# Patient Record
Sex: Male | Born: 1980 | Race: Black or African American | Hispanic: No | Marital: Single | Smoking: Current some day smoker
Health system: Southern US, Community
[De-identification: ages and names within clinical notes are randomized; demographics above are authoritative.]

---

## 2004-03-03 ENCOUNTER — Emergency Department: Payer: Self-pay | Admitting: Internal Medicine

## 2006-02-15 ENCOUNTER — Emergency Department: Payer: Self-pay | Admitting: Emergency Medicine

## 2006-12-16 ENCOUNTER — Emergency Department: Payer: Self-pay | Admitting: Emergency Medicine

## 2008-02-15 ENCOUNTER — Emergency Department: Payer: Self-pay | Admitting: Emergency Medicine

## 2015-02-11 ENCOUNTER — Emergency Department
Admission: EM | Admit: 2015-02-11 | Discharge: 2015-02-11 | Disposition: A | Discharge status: Home / Self Care | Payer: Self-pay | Attending: Emergency Medicine | Admitting: Emergency Medicine

## 2015-02-11 DIAGNOSIS — F1910 Other psychoactive substance abuse, uncomplicated: Secondary | ICD-10-CM

## 2015-02-11 DIAGNOSIS — F1410 Cocaine abuse, uncomplicated: Secondary | ICD-10-CM | POA: Insufficient documentation

## 2015-02-11 DIAGNOSIS — F1010 Alcohol abuse, uncomplicated: Secondary | ICD-10-CM | POA: Insufficient documentation

## 2015-02-11 DIAGNOSIS — Z720 Tobacco use: Secondary | ICD-10-CM | POA: Insufficient documentation

## 2015-02-11 NOTE — Discharge Instructions (Signed)
Return to the emergency department for any worsening condition including chest pain, trouble breathing, palpitations, dizziness or passing out, confusion, weakness, or any other symptoms concerning to you.   Polysubstance Abuse When people abuse more than one drug or type of drug it is called polysubstance or polydrug abuse. For example, many smokers also drink alcohol. This is one form of polydrug abuse. Polydrug abuse also refers to the use of a drug to counteract an unpleasant effect produced by another drug. It may also be used to help with withdrawal from another drug. People who take stimulants may become agitated. Sometimes this agitation is countered with a tranquilizer. This helps protect against the unpleasant side effects. Polydrug abuse also refers to the use of different drugs at the same time.  Anytime drug use is interfering with normal living activities, it has become abuse. This includes problems with family and friends. Psychological dependence has developed when your mind tells you that the drug is needed. This is usually followed by physical dependence which has developed when continuing increases of drug are required to get the same feeling or "high". This is known as addiction or chemical dependency. A person's risk is much higher if there is a history of chemical dependency in the family. SIGNS OF CHEMICAL DEPENDENCY  You have been told by friends or family that drugs have become a problem.  You fight when using drugs.  You are having blackouts (not remembering what you do while using).  You feel sick from using drugs but continue using.  You lie about use or amounts of drugs (chemicals) used.  You need chemicals to get you going.  You are suffering in work performance or in school because of drug use.  You get sick from use of drugs but continue to use anyway.  You need drugs to relate to people or feel comfortable in social situations.  You use drugs to forget  problems. "Yes" answered to any of the above signs of chemical dependency indicates there are problems. The longer the use of drugs continues, the greater the problems will become. If there is a family history of drug or alcohol use, it is best not to experiment with these drugs. Continual use leads to tolerance. After tolerance develops more of the drug is needed to get the same feeling. This is followed by addiction. With addiction, drugs become the most important part of life. It becomes more important to take drugs than participate in the other usual activities of life. This includes relating to friends and family. Addiction is followed by dependency. Dependency is a condition where drugs are now needed not just to get high, but to feel normal. Addiction cannot be cured but it can be stopped. This often requires outside help and the care of professionals. Treatment centers are listed in the yellow pages under: Cocaine, Narcotics, and Alcoholics Anonymous. Most hospitals and clinics can refer you to a specialized care center. Talk to your caregiver if you need help.   This information is not intended to replace advice given to you by your health care provider. Make sure you discuss any questions you have with your health care provider.   Document Released: 11/17/2004 Document Revised: 06/20/2011 Document Reviewed: 04/02/2014 Elsevier Interactive Patient Education [employer]2016 Elsevier Inc.

## 2015-02-11 NOTE — ED Provider Notes (Signed)
[facility] Emergency Department Provider Note   ____________________________________________  Time seen: 6:20 PM I have reviewed the triage vital signs and the triage nursing note.  HISTORY  Chief Complaint Medical Clearance   Historian Patient  HPI Jonathan MarionLawrence Franckowiak Jr. is a 34 y.o. male is here for "medical clearance "for evaluation/admission for drug [facility] center [facility].  Reports a history of using cocaine for which he would like to quit.  He also drinks when he is using cocaine. He denies narcotic use.  States she's never been to a drug rehabilitation program before. No depression or suicidal ideation.  Patient is a symptomatic at present, with no chest pain or trouble breathing.    History reviewed. No pertinent past medical history. polysubstance abuse  There are no active problems to display for this patient.   History reviewed. No pertinent past surgical history.  No current outpatient prescriptions on file.  Allergies Review of patient's allergies indicates no known allergies.  History reviewed. No pertinent family history.  Social History Social History  Substance Use Topics  . Smoking status: Current Some Day Smoker  . Smokeless tobacco: None  . Alcohol Use: 14.4 oz/week    24 Cans of beer per week    Review of Systems  Constitutional: Negative for fever. Eyes: Negative for visual changes. ENT: Negative for sore throat. Cardiovascular: Negative for chest pain. Respiratory: Negative for shortness of breath. Gastrointestinal: Negative for abdominal pain, vomiting and diarrhea. Genitourinary: Negative for dysuria. Musculoskeletal: Negative for back pain. Skin: Negative for rash. Neurological: Negative for headache. 10 point Review of Systems otherwise negative ____________________________________________   PHYSICAL EXAM:  VITAL SIGNS: ED Triage Vitals  Enc Vitals Group     BP --       Pulse Rate 02/11/15 1545 92     Resp --      Temp 02/11/15 1545 98.1 F (36.7 C)     Temp Source 02/11/15 1545 Oral     SpO2 02/11/15 1545 95 %     Weight 02/11/15 1545 216 lb (97.977 kg)     Height 02/11/15 1545 5\' 9"  (1.753 m)     Head Cir --      Peak Flow --      Pain Score --      Pain Loc --      Pain Edu? --      Excl. in GC? --      Constitutional: Alert and oriented. Well appearing and in no distress. Eyes: Conjunctivae are normal. PERRL. Normal extraocular movements. ENT   Head: Normocephalic and atraumatic.   Nose: No congestion/rhinnorhea.   Mouth/Throat: Mucous membranes are moist.   Neck: No stridor. Cardiovascular/Chest: Normal rate, regular rhythm.  No murmurs, rubs, or gallops. Respiratory: Normal respiratory effort without tachypnea nor retractions. Breath sounds are clear and equal bilaterally. No wheezes/rales/rhonchi. Gastrointestinal: Soft. No distention, no guarding, no rebound. Nontender  Genitourinary/rectal:Deferred Musculoskeletal: Nontender with normal range of motion in all extremities.  Neurologic:  Normal speech and language. No gross or focal neurologic deficits are appreciated. Skin:  Skin is warm, dry and intact. No rash noted. Psychiatric: Mood and affect are normal. Speech and behavior are normal. Patient exhibits appropriate insight and judgment.  ____________________________________________   EKG I, Governor Rooksebecca Larico Dimock, MD, the attending physician have personally viewed and interpreted all ECGs.   No EKG performed ____________________________________________  LABS (pertinent positives/negatives)  None  ____________________________________________  RADIOLOGY All Xrays were viewed by me. Imaging interpreted  by Radiologist.  None __________________________________________  PROCEDURES  Procedure(s) performed: None  Critical Care performed: None  ____________________________________________   ED COURSE /  ASSESSMENT AND PLAN  CONSULTATIONS: None  Pertinent labs & imaging results that were available during my care of the patient were reviewed by me and considered in my medical decision making (see chart for details).   Patient is well-appearing and asymptomatic. He does not appear to be intoxicated nor in any withdrawal syndrome. His vital signs are stable. No concern for psychiatric emergency condition.  Patient is stable for discharge, where he can follow-up with residential treatment services.  Patient / Family / Caregiver informed of clinical course, medical decision-making process, and agree with plan.   I discussed return precautions, follow-up instructions, and discharged instructions with patient and/or family.  ___________________________________________   FINAL CLINICAL IMPRESSION(S) / ED DIAGNOSES   Final diagnoses:  Cocaine abuse  Alcohol abuse  Substance abuse       Governor Rooks, MD 02/11/15 [phone]

## 2015-02-11 NOTE — ED Notes (Signed)
Pt verbalized understanding of follow up care. Has ride to [facility] and knows the address. [facility] called and made aware that pt is on the way.

## 2015-02-11 NOTE — ED Notes (Signed)
Patient here for medical clearance 

## 2015-10-11 ENCOUNTER — Emergency Department
Admission: EM | Admit: 2015-10-11 | Discharge: 2015-10-11 | Disposition: A | Discharge status: Home / Self Care | Payer: Self-pay | Attending: Emergency Medicine | Admitting: Emergency Medicine

## 2015-10-11 DIAGNOSIS — R401 Stupor: Secondary | ICD-10-CM | POA: Insufficient documentation

## 2015-10-11 DIAGNOSIS — F1290 Cannabis use, unspecified, uncomplicated: Secondary | ICD-10-CM | POA: Insufficient documentation

## 2015-10-11 DIAGNOSIS — F14121 Cocaine abuse with intoxication with delirium: Secondary | ICD-10-CM | POA: Insufficient documentation

## 2015-10-11 DIAGNOSIS — F17200 Nicotine dependence, unspecified, uncomplicated: Secondary | ICD-10-CM | POA: Insufficient documentation

## 2015-10-11 LAB — HEPATIC FUNCTION PANEL
ALBUMIN: 5.3 g/dL — AB (ref 3.5–5.0)
ALK PHOS: 69 U/L (ref 38–126)
ALT: 79 U/L — AB (ref 17–63)
AST: 68 U/L — AB (ref 15–41)
BILIRUBIN TOTAL: 0.8 mg/dL (ref 0.3–1.2)
Bilirubin, Direct: <0.1 mg/dL — ABNORMAL LOW (ref 0.1–0.5)
Total Protein: 9.3 g/dL — ABNORMAL HIGH (ref 6.5–8.1)

## 2015-10-11 LAB — URINALYSIS COMPLETE WITH MICROSCOPIC (ARMC ONLY)
BACTERIA UA: NONE SEEN
BILIRUBIN URINE: NEGATIVE
Glucose, UA: NEGATIVE mg/dL
LEUKOCYTES UA: NEGATIVE
NITRITE: NEGATIVE
PH: 5 (ref 5.0–8.0)
PROTEIN: 100 mg/dL — AB
SPECIFIC GRAVITY, URINE: 1.013 (ref 1.005–1.030)

## 2015-10-11 LAB — CBC WITH DIFFERENTIAL/PLATELET
Basophils Absolute: 0 10*3/uL (ref 0–0.1)
Basophils Relative: 0 %
EOS ABS: 0 10*3/uL (ref 0–0.7)
EOS PCT: 0 %
HCT: 45.4 % (ref 40.0–52.0)
Hemoglobin: 15.1 g/dL (ref 13.0–18.0)
LYMPHS ABS: 0.9 10*3/uL — AB (ref 1.0–3.6)
LYMPHS PCT: 7 %
MCH: 28.3 pg (ref 26.0–34.0)
MCHC: 33.2 g/dL (ref 32.0–36.0)
MCV: 85.4 fL (ref 80.0–100.0)
MONO ABS: 0.7 10*3/uL (ref 0.2–1.0)
MONOS PCT: 5 %
Neutro Abs: 12 10*3/uL — ABNORMAL HIGH (ref 1.4–6.5)
Neutrophils Relative %: 88 %
PLATELETS: 314 10*3/uL (ref 150–440)
RBC: 5.32 MIL/uL (ref 4.40–5.90)
RDW: 14.5 % (ref 11.5–14.5)
WBC: 13.7 10*3/uL — AB (ref 3.8–10.6)

## 2015-10-11 LAB — BASIC METABOLIC PANEL
Anion gap: 17 — ABNORMAL HIGH (ref 5–15)
BUN: 23 mg/dL — AB (ref 6–20)
CHLORIDE: 109 mmol/L (ref 101–111)
CO2: 17 mmol/L — ABNORMAL LOW (ref 22–32)
CREATININE: 1.83 mg/dL — AB (ref 0.61–1.24)
Calcium: 10.1 mg/dL (ref 8.9–10.3)
GFR calc Af Amer: 53 mL/min — ABNORMAL LOW (ref >60)
GFR calc non Af Amer: 46 mL/min — ABNORMAL LOW (ref >60)
GLUCOSE: 95 mg/dL (ref 65–99)
Potassium: 3.8 mmol/L (ref 3.5–5.1)
SODIUM: 143 mmol/L (ref 135–145)

## 2015-10-11 LAB — URINE DRUG SCREEN, QUALITATIVE (ARMC ONLY)
Amphetamines, Ur Screen: NOT DETECTED
BENZODIAZEPINE, UR SCRN: NOT DETECTED
Barbiturates, Ur Screen: NOT DETECTED
CANNABINOID 50 NG, UR ~~LOC~~: NOT DETECTED
Cocaine Metabolite,Ur ~~LOC~~: POSITIVE — AB
MDMA (ECSTASY) UR SCREEN: NOT DETECTED
Methadone Scn, Ur: NOT DETECTED
OPIATE, UR SCREEN: NOT DETECTED
PHENCYCLIDINE (PCP) UR S: NOT DETECTED
Tricyclic, Ur Screen: NOT DETECTED

## 2015-10-11 LAB — LIPASE, BLOOD: Lipase: 17 U/L (ref 11–51)

## 2015-10-11 LAB — TROPONIN I
Troponin I: 0.04 ng/mL (ref <0.03)
Troponin I: 0.07 ng/mL (ref <0.03)

## 2015-10-11 LAB — SALICYLATE LEVEL

## 2015-10-11 LAB — ETHANOL: ALCOHOL ETHYL (B): 18 mg/dL — AB (ref <5)

## 2015-10-11 LAB — ACETAMINOPHEN LEVEL: Acetaminophen (Tylenol), Serum: <10 ug/mL — ABNORMAL LOW (ref 10–30)

## 2015-10-11 MED ORDER — SODIUM CHLORIDE 0.9 % IV BOLUS (SEPSIS)
500.0000 mL | Freq: Once | INTRAVENOUS | Status: AC
  Administered 2015-10-11: 500 mL via INTRAVENOUS

## 2015-10-11 MED ORDER — LORAZEPAM 2 MG/ML IJ SOLN
4.0000 mg | Freq: Once | INTRAMUSCULAR | Status: AC
  Administered 2015-10-11: 4 mg via INTRAVENOUS

## 2015-10-11 MED ORDER — ASPIRIN 81 MG PO CHEW
324.0000 mg | CHEWABLE_TABLET | Freq: Once | ORAL | Status: AC
  Administered 2015-10-11: 324 mg via ORAL
  Filled 2015-10-11: qty 4

## 2015-10-11 MED ORDER — CHARCOAL ACTIVATED PO LIQD
ORAL | Status: AC
  Filled 2015-10-11: qty 240

## 2015-10-11 MED ORDER — ACTIDOSE WITH SORBITOL 50 GM/240ML PO LIQD
50.0000 g | Freq: Once | ORAL | Status: DC
  Filled 2015-10-11: qty 240

## 2015-10-11 MED ORDER — LORAZEPAM 2 MG/ML IJ SOLN
INTRAMUSCULAR | Status: AC
  Administered 2015-10-11: 4 mg via INTRAVENOUS
  Filled 2015-10-11: qty 2

## 2015-10-11 MED ORDER — CHARCOAL ACTIVATED PO LIQD
ORAL | Status: AC
  Administered 2015-10-11: 50 g
  Filled 2015-10-11: qty 240

## 2015-10-11 MED ORDER — CHARCOAL ACTIVATED PO LIQD: 50.0000 g | Freq: Once | ORAL | Status: DC

## 2015-10-11 NOTE — Discharge Instructions (Signed)
Polysubstance Abuse °When people abuse more than one drug or type of drug it is called polysubstance or polydrug abuse. For example, many smokers also drink alcohol. This is one form of polydrug abuse. Polydrug abuse also refers to the use of a drug to counteract an unpleasant effect produced by another drug. It may also be used to help with withdrawal from another drug. People who take stimulants may become agitated. Sometimes this agitation is countered with a tranquilizer. This helps protect against the unpleasant side effects. Polydrug abuse also refers to the use of different drugs at the same time.  °Anytime drug use is interfering with normal living activities, it has become abuse. This includes problems with family and friends. Psychological dependence has developed when your mind tells you that the drug is needed. This is usually followed by physical dependence which has developed when continuing increases of drug are required to get the same feeling or "high". This is known as addiction or chemical dependency. A person's risk is much higher if there is a history of chemical dependency in the family. °SIGNS OF CHEMICAL DEPENDENCY °· You have been told by friends or family that drugs have become a problem. °· You fight when using drugs. °· You are having blackouts (not remembering what you do while using). °· You feel sick from using drugs but continue using. °· You lie about use or amounts of drugs (chemicals) used. °· You need chemicals to get you going. °· You are suffering in work performance or in school because of drug use. °· You get sick from use of drugs but continue to use anyway. °· You need drugs to relate to people or feel comfortable in social situations. °· You use drugs to forget problems. °"Yes" answered to any of the above signs of chemical dependency indicates there are problems. The longer the use of drugs continues, the greater the problems will become. °If there is a family history of  drug or alcohol use, it is best not to experiment with these drugs. Continual use leads to tolerance. After tolerance develops more of the drug is needed to get the same feeling. This is followed by addiction. With addiction, drugs become the most important part of life. It becomes more important to take drugs than participate in the other usual activities of life. This includes relating to friends and family. Addiction is followed by dependency. Dependency is a condition where drugs are now needed not just to get high, but to feel normal. °Addiction cannot be cured but it can be stopped. This often requires outside help and the care of professionals. Treatment centers are listed in the yellow pages under: Cocaine, Narcotics, and Alcoholics Anonymous. Most hospitals and clinics can refer you to a specialized care center. Talk to your caregiver if you need help. °  °This information is not intended to replace advice given to you by your health care provider. Make sure you discuss any questions you have with your health care provider. °  °Document Released: 11/17/2004 Document Revised: 06/20/2011 Document Reviewed: 04/02/2014 °Elsevier Interactive Patient Education ©2016 Elsevier Inc. ° °

## 2015-10-11 NOTE — ED Notes (Signed)
Pt able to answer questions and cooperative. States he only took cocaine. Denies that it was laced with anything. Denies trying to harm himself or others.

## 2015-10-11 NOTE — ED Notes (Signed)
Patient discharge and follow up information reviewed with patient by ED nursing staff and patient given the opportunity to ask questions pertaining to ED visit and discharge plan of care. Patient advised that should symptoms not continue to improve, resolve entirely, or should new symptoms develop then a follow up visit with their PCP or a return visit to the ED may be warranted. Patient verbalized consent and understanding of discharge plan of care including potential need for further evaluation. Patient being discharged in stable condition per attending ED physician on duty. Pt given resources for addiction/substance abuse help and verbalizes understanding of effects of abuse on his body and need to follow up for help.

## 2015-10-11 NOTE — ED Notes (Signed)
Brought in by police, incoherent per mother and possible drug overdose per mom.

## 2015-10-11 NOTE — ED Provider Notes (Signed)
-----------------------------------------   7:50 PM on 10/11/2015 -----------------------------------------   Blood pressure 114/74, pulse 110, temperature 98.3 F (36.8 C), temperature source Oral, resp. rate 16, height 5\' 10"  (1.778 m), weight 200 lb (90.719 kg), SpO2 98 %.  Assuming care from Dr. Scotty CourtStafford.  In short, Alain MarionLawrence Vandenberghe Jr. is a 35 y.o. male with a chief complaint of Ingestion .  Refer to the original H&P for additional details.  The current plan of care is to *follow patient's troponin and progress here in emergency department. The patient did receive serial troponins the first level was 0.04 and repeat was 0.07. These levels were reviewed with the on-call cardiologist and since the patient has no complaints of chest pain or any ischemic changes on his EKG and had used a combination of cocaine and alcohol these are not unexpected results. The patient denies being suicidal, homicidal or having hallucinations. He's progressed well with the IM Ativan that was given on arrival. Has remained cooperative here in emergency department. He is requesting outpatient services for drug treatment program and these will be supplied to him. His IVC paperwork was removed by myself as I could not retain him now than he is awake alert and oriented and ambulatory. He continues tonight any suicidal thoughts, homicidal thoughts, hallucinations. Patient will be given cocaine and substance abuse instructions and advised to follow-up for outpatient treatment as mentioned above. He is given some IV fluids for what appeared to be some mild renal insufficiency which is likely dehydration.   Jennye MoccasinBrian S Mattilyn Crites, MD 10/11/15 [phone]

## 2015-10-11 NOTE — ED Notes (Signed)
Pt medicated with ativan 4 mg IM = pt is unable to focus, answer questions or follow directions of even the simplest type. Pt is here for ingestion of unknown drugs. Known to use cocaine and amphetamines. Officers and Customer service managerquad tech in with pt

## 2015-10-11 NOTE — ED Provider Notes (Addendum)
[facility] Emergency Department Provider Note  ____________________________________________  Time seen: 2:45 PM  I have reviewed the triage vital signs and the nursing notes.   HISTORY  Chief Complaint Ingestion  Level 5 caveat:  Portions of the history and physical were unable to be obtained due to the patient's acute altered mental status   HPI Alain MarionLawrence Ruder Jr. is a 35 y.o. male brought to the ED by police under involuntary commitment due to altered mental status. They were called to the mother's house with the patient showed up and was acting bizarrely and incoherently and the mother was worried about her safety.Patient does not respond to any questions. He is not interactive. Police report that they are familiar with this person and he has a history of cocaine abuse.  Currently being physically restrained by 4 police officers Sheriff and a med tech.     No past medical history on file. Cocaine abuse  There are no active problems to display for this patient.    No past surgical history on file. None  No current outpatient prescriptions on file. None  Allergies Review of patient's allergies indicates no known allergies.   No family history on file.  Social History Social History  Substance Use Topics  . Smoking status: Current Some Day Smoker  . Smokeless tobacco: Not on file  . Alcohol Use: 14.4 oz/week    24 Cans of beer per week    Review of Systems Unable to obtain ____________________________________________   PHYSICAL EXAM:  VITAL SIGNS: ED Triage Vitals  Enc Vitals Group     BP --      Pulse --      Resp --      Temp --      Temp src --      SpO2 --      Weight --      Height --      Head Cir --      Peak Flow --      Pain Score --      Pain Loc --      Pain Edu? --      Excl. in GC? --     Vital signs reviewed, nursing assessments reviewed.   Constitutional:   Awake, stuporous. No distress. Eyes:    No scleral icterus. No conjunctival pallor. PERRL. EOMI. Marland Kitchen. ENT   Head:   Normocephalic and atraumatic.   Nose:   No congestion/rhinnorhea. No septal hematoma   Mouth/Throat:   MMM, no pharyngeal erythema. No peritonsillar mass. White foamy material in the mouth   Neck:   No stridor. No SubQ emphysema. No meningismus. Hematological/Lymphatic/Immunilogical:   No cervical lymphadenopathy. Cardiovascular:   RRR. Symmetric bilateral radial and DP pulses.  No murmurs.  Respiratory:   Normal respiratory effort without tachypnea nor retractions. Breath sounds are clear and equal bilaterally. No wheezes/rales/rhonchi. Gastrointestinal:   Soft and nontender. Non distended. There is no CVA tenderness.  No rebound, rigidity, or guarding. Genitourinary:   deferred Musculoskeletal:   Nontender with normal range of motion in all extremities. No joint effusions.  No lower extremity tenderness.  No edema. Neurologic:   Not interactive, stuporous CN 2-10 normal. Motor grossly intact. No gross focal neurologic deficits are appreciated.  Skin:    Skin is warm, dry and intact. No rash noted.  No petechiae, purpura, or bullae.  ____________________________________________    LABS (pertinent positives/negatives) (all labs ordered are listed, but only abnormal results are displayed)  Labs Reviewed  URINALYSIS COMPLETEWITH MICROSCOPIC ([facility] ONLY)  URINE DRUG SCREEN, QUALITATIVE ([facility] ONLY)  BASIC METABOLIC PANEL  CBC WITH DIFFERENTIAL/PLATELET  TROPONIN I  HEPATIC FUNCTION PANEL  LIPASE, BLOOD  SALICYLATE LEVEL  ACETAMINOPHEN LEVEL   ____________________________________________   EKG  Interpreted by me Sinus tachycardia rate 125, normal axis intervals QRS ST segments and T waves  ____________________________________________    RADIOLOGY    ____________________________________________   PROCEDURES CRITICAL CARE Performed by: Scotty CourtSTAFFORD, Remie Mathison   Total critical care time: 35  minutes  Critical care time was exclusive of separately billable procedures and treating other patients.  Critical care was necessary to treat or prevent imminent or life-threatening deterioration.  Critical care was time spent personally by me on the following activities: development of treatment plan with patient and/or surrogate as well as nursing, discussions with consultants, evaluation of patient's response to treatment, examination of patient, obtaining history from patient or surrogate, ordering and performing treatments and interventions, ordering and review of laboratory studies, ordering and review of radiographic studies, pulse oximetry and re-evaluation of patient's condition.   ____________________________________________   INITIAL IMPRESSION / ASSESSMENT AND PLAN / ED COURSE  Pertinent labs & imaging results that were available during my care of the patient were reviewed by me and considered in my medical decision making (see chart for details).  Patient arrives with altered mental status. Due to agitation and requiring physical restraint by multiple police officers, patient was given intramuscular Ativan on an emergent basis to help reverse what is likely a cocaine toxidrome. Labs including EKG and troponin are pending. After Ativan patient is calm and sitting upright awake in bed. Due to lack of clarity about what his intoxicating substance and without any possible coingestants are, he is given a dose of oral charcoal. Does not appear to be having any respiratory distress, able to swallow by mouth and safe to take charcoal. We'll follow-up labs, remains under involuntary commitment petition. Case signed out to Dr. Huel CoteQuigley.     ____________________________________________   FINAL CLINICAL IMPRESSION(S) / ED DIAGNOSES  Final diagnoses:  Stupor  Acute cocaine intoxication     Portions of this note were generated with dragon dictation software. Dictation errors may  occur despite best attempts at proofreading.   Sharman CheekPhillip Timathy Newberry, MD 10/11/15 1648  Sharman CheekPhillip Oyinkansola Truax, MD 10/11/15 [phone]

## 2017-06-04 ENCOUNTER — Ambulatory Visit
Admission: EM | Admit: 2017-06-04 | Discharge: 2017-06-04 | Disposition: A | Discharge status: Home / Self Care | Payer: Self-pay | Attending: Family Medicine | Admitting: Family Medicine

## 2017-06-04 ENCOUNTER — Encounter: Payer: Self-pay | Admitting: Emergency Medicine

## 2017-06-04 ENCOUNTER — Other Ambulatory Visit: Payer: Self-pay

## 2017-06-04 DIAGNOSIS — J111 Influenza due to unidentified influenza virus with other respiratory manifestations: Secondary | ICD-10-CM

## 2017-06-04 DIAGNOSIS — R05 Cough: Secondary | ICD-10-CM

## 2017-06-04 MED ORDER — OSELTAMIVIR PHOSPHATE 75 MG PO CAPS: 75.0000 mg | ORAL_CAPSULE | Freq: Two times a day (BID) | ORAL | 0 refills | Status: DC

## 2017-06-04 NOTE — ED Triage Notes (Signed)
Patient c/o cough, chest congestion, bodyaches and fever that started yesterday.

## 2017-06-04 NOTE — ED Provider Notes (Signed)
[facility]  CSN: [id number] Arrival date & time: 06/04/17  1226  History   Chief Complaint Chief Complaint  Patient presents with  . Cough  . Generalized Body Aches  . Fever   HPI  37 year old male presents with concerns for influenza.  Patient states that he developed symptoms yesterday.  He had cough, body aches, fever, chills.  Fever, T-max 102.  No known exacerbating relieving factors.  He is recently been exposed to the flu as his child has been sick with the flu recently.  Symptoms are severe.  No other associated symptoms.  No other complaints concerns at this time.  PMH- History of substance abuse.  Surgical Hx - no past surgeries.  Home Medications    Prior to Admission medications   Medication Sig Start Date End Date Taking? Authorizing Provider  oseltamivir (TAMIFLU) 75 MG capsule Take 1 capsule (75 mg total) by mouth every 12 (twelve) hours. 06/04/17   Tommie Samsook, Janifer Gieselman G, DO   Family History No family hx.  Social History Social History   Tobacco Use  . Smoking status: Current Some Day Smoker    Types: Cigarettes  . Smokeless tobacco: Never Used  Substance Use Topics  . Alcohol use: Yes    Alcohol/week: 14.4 oz    Types: 24 Cans of beer per week  . Drug use: Yes    Types: Marijuana, Cocaine   Allergies   Patient has no known allergies.   Review of Systems Review of Systems  Constitutional: Positive for chills and fever.  Respiratory: Positive for cough.   Musculoskeletal:       Body aches.   Physical Exam Triage Vital Signs ED Triage Vitals  Enc Vitals Group     BP 06/04/17 1253 116/89     Pulse Rate 06/04/17 1253 100     Resp 06/04/17 1253 16     Temp 06/04/17 1253 98.9 F (37.2 C)     Temp Source 06/04/17 1253 Oral     SpO2 06/04/17 1253 97 %     Weight 06/04/17 1251 200 lb (90.7 kg)     Height 06/04/17 1251 5\' 9"  (1.753 m)     Head Circumference --      Peak Flow --      Pain Score 06/04/17 1251 7     Pain Loc --      Pain  Edu? --      Excl. in GC? --    Updated Vital Signs BP 116/89 (BP Location: Left Arm)   Pulse 100   Temp 98.9 F (37.2 C) (Oral)   Resp 16   Ht 5\' 9"  (1.753 m)   Wt 200 lb (90.7 kg)   SpO2 97%   BMI 29.53 kg/m   Physical Exam  Constitutional: He is oriented to person, place, and time. He appears well-developed. No distress.  HENT:  Head: Normocephalic and atraumatic.  Mouth/Throat: Oropharynx is clear and moist.  Cardiovascular: Regular rhythm.  Tachycardic.  Pulmonary/Chest: Effort normal and breath sounds normal.  Neurological: He is alert and oriented to person, place, and time.  Psychiatric: He has a normal mood and affect. His behavior is normal.  Nursing note and vitals reviewed.  UC Treatments / Results  Labs (all labs ordered are listed, but only abnormal results are displayed) Labs Reviewed - No data to display  EKG  EKG Interpretation None       Radiology No results found.  Procedures Procedures (including critical care time)  Medications Ordered  in UC Medications - No data to display   Initial Impression / Assessment and Plan / UC Course  I have reviewed the triage vital signs and the nursing notes.  Pertinent labs & imaging results that were available during my care of the patient were reviewed by me and considered in my medical decision making (see chart for details).     37 year old male presents with influenza.  Treating with Tamiflu.  Final Clinical Impressions(s) / UC Diagnoses   Final diagnoses:  Influenza    ED Discharge Orders        Ordered    oseltamivir (TAMIFLU) 75 MG capsule  Every 12 hours     06/04/17 1321     Controlled Substance Prescriptions [place] Controlled Substance Registry consulted? Not Applicable   Tommie Sams, DO 06/04/17 1414

## 2017-06-07 ENCOUNTER — Telehealth: Payer: Self-pay | Admitting: Emergency Medicine

## 2017-06-07 NOTE — Telephone Encounter (Signed)
Called to follow up after patient's recent visit. Patient states he is feeling better. 

## 2018-03-05 ENCOUNTER — Ambulatory Visit
Admission: EM | Admit: 2018-03-05 | Discharge: 2018-03-05 | Disposition: A | Discharge status: Home / Self Care | Payer: Self-pay | Attending: Family Medicine | Admitting: Family Medicine

## 2018-03-05 ENCOUNTER — Other Ambulatory Visit: Payer: Self-pay

## 2018-03-05 ENCOUNTER — Encounter: Payer: Self-pay | Admitting: Emergency Medicine

## 2018-03-05 DIAGNOSIS — R69 Illness, unspecified: Secondary | ICD-10-CM

## 2018-03-05 DIAGNOSIS — R21 Rash and other nonspecific skin eruption: Secondary | ICD-10-CM

## 2018-03-05 DIAGNOSIS — R0981 Nasal congestion: Secondary | ICD-10-CM

## 2018-03-05 DIAGNOSIS — J111 Influenza due to unidentified influenza virus with other respiratory manifestations: Secondary | ICD-10-CM

## 2018-03-05 LAB — RAPID INFLUENZA A&B ANTIGENS: Influenza B (ARMC): NEGATIVE

## 2018-03-05 LAB — RAPID INFLUENZA A&B ANTIGENS (ARMC ONLY): INFLUENZA A (ARMC): NEGATIVE

## 2018-03-05 MED ORDER — VALACYCLOVIR HCL 1 G PO TABS: 1000.0000 mg | ORAL_TABLET | Freq: Three times a day (TID) | ORAL | 0 refills | Status: DC

## 2018-03-05 MED ORDER — OSELTAMIVIR PHOSPHATE 75 MG PO CAPS: 75.0000 mg | ORAL_CAPSULE | Freq: Two times a day (BID) | ORAL | 0 refills | Status: DC

## 2018-03-05 NOTE — ED Provider Notes (Signed)
[facility]    CSN: [id number] Arrival date & time: 03/05/18  1648   History   Chief Complaint Chief Complaint  Patient presents with  . Nasal Congestion  . Fever   HPI  37 year old male presents with respiratory symptoms.  Patient reports that he developed headache on Saturday.  Worsened on Sunday and today.  He reports body aches and subjective fever.  Chills as well.  Feels poorly.  Mild cough.  Has had some upper respiratory symptoms as well.  Mild sore throat.  No known exacerbating or relieving factors.  No other associated symptoms.  No other complaints.  Social Hx reviewed as below. Social History Social History   Tobacco Use  . Smoking status: Current Some Day Smoker    Types: Cigarettes  . Smokeless tobacco: Never Used  Substance Use Topics  . Alcohol use: Yes    Alcohol/week: 24.0 standard drinks    Types: 24 Cans of beer per week  . Drug use: Yes    Types: Marijuana, Cocaine    Comment: 2 days ago   Allergies   Patient has no known allergies.   Review of Systems Review of Systems Per HPI  Physical Exam Triage Vital Signs ED Triage Vitals  Enc Vitals Group     BP 03/05/18 1728 133/85     Pulse Rate 03/05/18 1728 93     Resp 03/05/18 1728 18     Temp 03/05/18 1728 99.3 F (37.4 C)     Temp Source 03/05/18 1728 Oral     SpO2 03/05/18 1728 97 %     Weight 03/05/18 1729 210 lb (95.3 kg)     Height 03/05/18 1729 5\' 9"  (1.753 m)     Head Circumference --      Peak Flow --      Pain Score 03/05/18 1729 10     Pain Loc --      Pain Edu? --      Excl. in GC? --    Updated Vital Signs BP 133/85 (BP Location: Right Arm)   Pulse 93   Temp 99.3 F (37.4 C) (Oral)   Resp 18   Ht 5\' 9"  (1.753 m)   Wt 95.3 kg   SpO2 97%   BMI 31.01 kg/m   Visual Acuity Right Eye Distance:   Left Eye Distance:   Bilateral Distance:    Right Eye Near:   Left Eye Near:    Bilateral Near:     Physical Exam  Constitutional: He is oriented to  person, place, and time. He appears well-developed. No distress.  HENT:  Head: Normocephalic and atraumatic.  Oropharynx with mild erythema.  Neck:  Lymphadenopathy.  Cardiovascular: Normal rate and regular rhythm.  Pulmonary/Chest: Effort normal and breath sounds normal. He has no wheezes. He has no rales.  Neurological: He is alert and oriented to person, place, and time.  Skin:  Patient noted to have a small area of vesicular rash on the posterior aspect of his neck.  States that it is been mildly painful.  Psychiatric: He has a normal mood and affect. His behavior is normal.  Nursing note and vitals reviewed.  UC Treatments / Results  Labs (all labs ordered are listed, but only abnormal results are displayed) Labs Reviewed  RAPID INFLUENZA A&B ANTIGENS ([facility] ONLY)    EKG None  Radiology No results found.  Procedures Procedures (including critical care time)  Medications Ordered in UC Medications - No data to display  Initial  Impression / Assessment and Plan / UC Course  I have reviewed the triage vital signs and the nursing notes.  Pertinent labs & imaging results that were available during my care of the patient were reviewed by me and considered in my medical decision making (see chart for details).    37 year old male presents with influenza-like illness.  Testing was negative here.  Cannot rule out influenza.  Placing on Tamiflu to be conservative.  Patient has a small area of rash concerning for shingles.  Placing on Valtrex.  Advised rest, fluids, and symptomatic care.  Final Clinical Impressions(s) / UC Diagnoses   Final diagnoses:  Influenza-like illness     Discharge Instructions     Rest, fluids.  Medication as directed.  Take care  Dr. Adriana Simas    ED Prescriptions    Medication Sig Dispense Auth. Provider   oseltamivir (TAMIFLU) 75 MG capsule Take 1 capsule (75 mg total) by mouth every 12 (twelve) hours. 10 capsule Neyra Pettie G, DO    valACYclovir (VALTREX) 1000 MG tablet Take 1 tablet (1,000 mg total) by mouth 3 (three) times daily. 21 tablet Tommie Sams, DO     Controlled Substance Prescriptions [place] Controlled Substance Registry consulted? Not Applicable   Tommie Sams, DO 03/05/18 2025

## 2018-03-05 NOTE — ED Triage Notes (Signed)
Patient c/o cough, body aches, chills and fever that started 1 week ago. Patient stated 1 week ago he also had a sore throat and petechia around his eyes for 1 day.

## 2018-03-05 NOTE — Discharge Instructions (Signed)
Rest, fluids. ° °Medication as directed. ° °Take care ° °Dr. Alaisha Eversley  °

## 2019-06-04 ENCOUNTER — Ambulatory Visit: Payer: Self-pay | Admitting: *Deleted

## 2019-06-04 ENCOUNTER — Encounter: Payer: Self-pay | Admitting: Emergency Medicine

## 2019-06-04 ENCOUNTER — Inpatient Hospital Stay
Admission: EM | Admit: 2019-06-04 | Discharge: 2019-06-07 | DRG: 558 | Disposition: A | Discharge status: Home / Self Care | Payer: Self-pay | Attending: Internal Medicine | Admitting: Internal Medicine

## 2019-06-04 ENCOUNTER — Emergency Department: Payer: Self-pay

## 2019-06-04 ENCOUNTER — Other Ambulatory Visit: Payer: Self-pay

## 2019-06-04 DIAGNOSIS — I10 Essential (primary) hypertension: Secondary | ICD-10-CM | POA: Diagnosis present

## 2019-06-04 DIAGNOSIS — N179 Acute kidney failure, unspecified: Secondary | ICD-10-CM | POA: Diagnosis present

## 2019-06-04 DIAGNOSIS — K8020 Calculus of gallbladder without cholecystitis without obstruction: Secondary | ICD-10-CM | POA: Diagnosis present

## 2019-06-04 DIAGNOSIS — M6282 Rhabdomyolysis: Principal | ICD-10-CM | POA: Diagnosis present

## 2019-06-04 DIAGNOSIS — M5127 Other intervertebral disc displacement, lumbosacral region: Secondary | ICD-10-CM | POA: Diagnosis present

## 2019-06-04 DIAGNOSIS — Z79899 Other long term (current) drug therapy: Secondary | ICD-10-CM

## 2019-06-04 DIAGNOSIS — F1010 Alcohol abuse, uncomplicated: Secondary | ICD-10-CM | POA: Diagnosis present

## 2019-06-04 DIAGNOSIS — M5410 Radiculopathy, site unspecified: Secondary | ICD-10-CM | POA: Diagnosis present

## 2019-06-04 DIAGNOSIS — M47896 Other spondylosis, lumbar region: Secondary | ICD-10-CM | POA: Diagnosis present

## 2019-06-04 DIAGNOSIS — R7401 Elevation of levels of liver transaminase levels: Secondary | ICD-10-CM | POA: Diagnosis present

## 2019-06-04 DIAGNOSIS — F17210 Nicotine dependence, cigarettes, uncomplicated: Secondary | ICD-10-CM | POA: Diagnosis present

## 2019-06-04 DIAGNOSIS — Z20822 Contact with and (suspected) exposure to covid-19: Secondary | ICD-10-CM | POA: Diagnosis present

## 2019-06-04 DIAGNOSIS — K5900 Constipation, unspecified: Secondary | ICD-10-CM | POA: Diagnosis not present

## 2019-06-04 LAB — COMPREHENSIVE METABOLIC PANEL
ALT: 127 U/L — ABNORMAL HIGH (ref 0–44)
AST: 407 U/L — ABNORMAL HIGH (ref 15–41)
Albumin: 4.2 g/dL (ref 3.5–5.0)
Alkaline Phosphatase: 55 U/L (ref 38–126)
Anion gap: 13 (ref 5–15)
BUN: 12 mg/dL (ref 6–20)
CO2: 20 mmol/L — ABNORMAL LOW (ref 22–32)
Calcium: 8.7 mg/dL — ABNORMAL LOW (ref 8.9–10.3)
Chloride: 105 mmol/L (ref 98–111)
Creatinine, Ser: 1.27 mg/dL — ABNORMAL HIGH (ref 0.61–1.24)
GFR calc Af Amer: >60 mL/min (ref >60)
GFR calc non Af Amer: >60 mL/min (ref >60)
Glucose, Bld: 93 mg/dL (ref 70–99)
Potassium: 4.1 mmol/L (ref 3.5–5.1)
Sodium: 138 mmol/L (ref 135–145)
Total Bilirubin: 1.1 mg/dL (ref 0.3–1.2)
Total Protein: 7.9 g/dL (ref 6.5–8.1)

## 2019-06-04 LAB — CBC WITH DIFFERENTIAL/PLATELET
Abs Immature Granulocytes: 0.03 10*3/uL (ref 0.00–0.07)
Basophils Absolute: 0 10*3/uL (ref 0.0–0.1)
Basophils Relative: 0 %
Eosinophils Absolute: 0 10*3/uL (ref 0.0–0.5)
Eosinophils Relative: 0 %
HCT: 43.7 % (ref 39.0–52.0)
Hemoglobin: 14.6 g/dL (ref 13.0–17.0)
Immature Granulocytes: 0 %
Lymphocytes Relative: 14 %
Lymphs Abs: 1.6 10*3/uL (ref 0.7–4.0)
MCH: 28.9 pg (ref 26.0–34.0)
MCHC: 33.4 g/dL (ref 30.0–36.0)
MCV: 86.4 fL (ref 80.0–100.0)
Monocytes Absolute: 0.7 10*3/uL (ref 0.1–1.0)
Monocytes Relative: 6 %
Neutro Abs: 9.6 10*3/uL — ABNORMAL HIGH (ref 1.7–7.7)
Neutrophils Relative %: 80 %
Platelets: 303 10*3/uL (ref 150–400)
RBC: 5.06 MIL/uL (ref 4.22–5.81)
RDW: 13.8 % (ref 11.5–15.5)
WBC: 12 10*3/uL — ABNORMAL HIGH (ref 4.0–10.5)
nRBC: 0 % (ref 0.0–0.2)

## 2019-06-04 LAB — CBC
HCT: 42.2 % (ref 39.0–52.0)
Hemoglobin: 14.3 g/dL (ref 13.0–17.0)
MCH: 29.2 pg (ref 26.0–34.0)
MCHC: 33.9 g/dL (ref 30.0–36.0)
MCV: 86.3 fL (ref 80.0–100.0)
Platelets: 313 10*3/uL (ref 150–400)
RBC: 4.89 MIL/uL (ref 4.22–5.81)
RDW: 14 % (ref 11.5–15.5)
WBC: 11.4 10*3/uL — ABNORMAL HIGH (ref 4.0–10.5)
nRBC: 0 % (ref 0.0–0.2)

## 2019-06-04 LAB — URINALYSIS, COMPLETE (UACMP) WITH MICROSCOPIC
Bacteria, UA: NONE SEEN
Bilirubin Urine: NEGATIVE
Glucose, UA: NEGATIVE mg/dL
Ketones, ur: 20 mg/dL — AB
Leukocytes,Ua: NEGATIVE
Nitrite: NEGATIVE
Protein, ur: 100 mg/dL — AB
Specific Gravity, Urine: 1.013 (ref 1.005–1.030)
Squamous Epithelial / LPF: NONE SEEN (ref 0–5)
pH: 5 (ref 5.0–8.0)

## 2019-06-04 LAB — BASIC METABOLIC PANEL
Anion gap: 16 — ABNORMAL HIGH (ref 5–15)
BUN: 13 mg/dL (ref 6–20)
CO2: 20 mmol/L — ABNORMAL LOW (ref 22–32)
Calcium: 9.4 mg/dL (ref 8.9–10.3)
Chloride: 102 mmol/L (ref 98–111)
Creatinine, Ser: 1.26 mg/dL — ABNORMAL HIGH (ref 0.61–1.24)
GFR calc Af Amer: >60 mL/min (ref >60)
GFR calc non Af Amer: >60 mL/min (ref >60)
Glucose, Bld: 101 mg/dL — ABNORMAL HIGH (ref 70–99)
Potassium: 4.4 mmol/L (ref 3.5–5.1)
Sodium: 138 mmol/L (ref 135–145)

## 2019-06-04 LAB — CREATININE, SERUM
Creatinine, Ser: 1.33 mg/dL — ABNORMAL HIGH (ref 0.61–1.24)
GFR calc Af Amer: >60 mL/min (ref >60)
GFR calc non Af Amer: >60 mL/min (ref >60)

## 2019-06-04 LAB — CK: Total CK: 15227 U/L — ABNORMAL HIGH (ref 49–397)

## 2019-06-04 LAB — HIV ANTIBODY (ROUTINE TESTING W REFLEX): HIV Screen 4th Generation wRfx: NONREACTIVE

## 2019-06-04 LAB — SARS CORONAVIRUS 2 (TAT 6-24 HRS): SARS Coronavirus 2: NEGATIVE

## 2019-06-04 MED ORDER — SODIUM CHLORIDE 0.9 % IV SOLN: INTRAVENOUS | Status: DC

## 2019-06-04 MED ORDER — ONDANSETRON HCL 4 MG/2ML IJ SOLN: 4.0000 mg | Freq: Four times a day (QID) | INTRAMUSCULAR | Status: DC | PRN

## 2019-06-04 MED ORDER — MORPHINE SULFATE (PF) 4 MG/ML IV SOLN
4.0000 mg | INTRAVENOUS | Status: DC | PRN
  Administered 2019-06-04 – 2019-06-06 (×10): 4 mg via INTRAVENOUS
  Filled 2019-06-04 (×11): qty 1

## 2019-06-04 MED ORDER — HYDROMORPHONE HCL 1 MG/ML IJ SOLN
1.0000 mg | Freq: Once | INTRAMUSCULAR | Status: AC
  Administered 2019-06-04: 14:00:00 1 mg via INTRAVENOUS
  Filled 2019-06-04: qty 1

## 2019-06-04 MED ORDER — MORPHINE SULFATE (PF) 4 MG/ML IV SOLN
4.0000 mg | Freq: Once | INTRAVENOUS | Status: AC
  Administered 2019-06-04: 12:00:00 4 mg via INTRAVENOUS
  Filled 2019-06-04: qty 1

## 2019-06-04 MED ORDER — ONDANSETRON HCL 4 MG PO TABS: 4.0000 mg | ORAL_TABLET | Freq: Four times a day (QID) | ORAL | Status: DC | PRN

## 2019-06-04 MED ORDER — HYDROCODONE-ACETAMINOPHEN 5-325 MG PO TABS
1.0000 | ORAL_TABLET | ORAL | Status: DC | PRN
  Administered 2019-06-04 – 2019-06-05 (×2): 1 via ORAL
  Filled 2019-06-04 (×2): qty 1

## 2019-06-04 MED ORDER — TRAZODONE HCL 50 MG PO TABS
50.0000 mg | ORAL_TABLET | Freq: Every evening | ORAL | Status: DC | PRN
  Administered 2019-06-04: 22:00:00 50 mg via ORAL
  Filled 2019-06-04: qty 1

## 2019-06-04 MED ORDER — KETOROLAC TROMETHAMINE 30 MG/ML IJ SOLN
30.0000 mg | Freq: Once | INTRAMUSCULAR | Status: AC
  Administered 2019-06-04: 12:00:00 30 mg via INTRAVENOUS
  Filled 2019-06-04: qty 1

## 2019-06-04 MED ORDER — ACETAMINOPHEN 650 MG RE SUPP: 650.0000 mg | Freq: Four times a day (QID) | RECTAL | Status: DC | PRN

## 2019-06-04 MED ORDER — ACETAMINOPHEN 325 MG PO TABS: 650.0000 mg | ORAL_TABLET | Freq: Four times a day (QID) | ORAL | Status: DC | PRN

## 2019-06-04 MED ORDER — INFLUENZA VAC A&B SA ADJ QUAD 0.5 ML IM PRSY: 0.5000 mL | PREFILLED_SYRINGE | INTRAMUSCULAR | Status: DC

## 2019-06-04 MED ORDER — SODIUM CHLORIDE 0.9 % IV BOLUS
1000.0000 mL | Freq: Once | INTRAVENOUS | Status: AC
  Administered 2019-06-04: 1000 mL via INTRAVENOUS

## 2019-06-04 MED ORDER — HEPARIN SODIUM (PORCINE) 5000 UNIT/ML IJ SOLN
5000.0000 [IU] | Freq: Three times a day (TID) | INTRAMUSCULAR | Status: DC
  Administered 2019-06-04 – 2019-06-07 (×8): 5000 [IU] via SUBCUTANEOUS
  Filled 2019-06-04 (×8): qty 1

## 2019-06-04 MED ORDER — TRAMADOL HCL 50 MG PO TABS
50.0000 mg | ORAL_TABLET | Freq: Four times a day (QID) | ORAL | Status: DC | PRN
  Administered 2019-06-04: 18:00:00 50 mg via ORAL
  Filled 2019-06-04: qty 1

## 2019-06-04 NOTE — Telephone Encounter (Signed)
Pt reports severe 10/10 lower back pain, onset yesterday. Pain is constant , radiates down legs at times. States urine "Very" dark. Could not tell if blood present. States chills, subjective fever.  Pt states "Difficult to stand pain is so bad."  Pt directed to ED, states wife will drive or will call EMS if unable to ambulate safely. No PCP.  Reason for Disposition . [1] Fever > 100.0 F (37.8 C) AND [2] flank pain (i.e., in side, below ribs and above hip)    Subjective fever, urine dark  Answer Assessment - Initial Assessment Questions 1. ONSET: "When did the pain begin?"      Last night 2. LOCATION: "Where does it hurt?" (upper, mid or lower back)     Both sides lower back 3. SEVERITY: "How bad is the pain?"  (e.g., Scale 1-10; mild, moderate, or severe)   - MILD (1-3): doesn't interfere with normal activities    - MODERATE (4-7): interferes with normal activities or awakens from sleep    - SEVERE (8-10): excruciating pain, unable to do any normal activities      10/10 4. PATTERN: "Is the pain constant?" (e.g., yes, no; constant, intermittent)      constant 5. RADIATION: "Does the pain shoot into your legs or elsewhere?"     Left leg 6. CAUSE:  "What do you think is causing the back pain?"      Unsure 7. BACK OVERUSE:  "Any recent lifting of heavy objects, strenuous work or exercise?"     none 8. MEDICATIONS: "What have you taken so far for the pain?" (e.g., nothing, acetaminophen, NSAIDS)     nothing 9. NEUROLOGIC SYMPTOMS: "Do you have any weakness, numbness, or problems with bowel/bladder control?"     no 10. OTHER SYMPTOMS: "Do you have any other symptoms?" (e.g., fever, abdominal pain, burning with urination, blood in urine)      Urine "Very" dark  Protocols used: BACK PAIN-A-AH

## 2019-06-04 NOTE — ED Notes (Signed)
Presents with lower back pain this am  Also noticed some blood in urine

## 2019-06-04 NOTE — ED Notes (Signed)
Pt taken to xray 

## 2019-06-04 NOTE — ED Provider Notes (Signed)
[facility] Emergency Department Provider Note   ____________________________________________   First MD Initiated Contact with Patient 06/04/19 1138     (approximate)  I have reviewed the triage vital signs and the nursing notes.   HISTORY  Chief Complaint Back Pain    HPI Jonathan Stan. is a 39 y.o. male patient complain of acute onset of low back pain with a.m. awakening.  Patient stated heavy alcohol intake last night.  Patient states a radicular component to the mid left leg.  Patient also states that he had brown-colored urine this morning.  Patient stated no provocative activity for his complaint.    Patient rates pain as a 10/10.  Patient described pain as "sharp/achy".  No palliative measure for complaint.         History reviewed. No pertinent past medical history.  There are no problems to display for this patient.   History reviewed. No pertinent surgical history.  Prior to Admission medications   Medication Sig Start Date End Date Taking? Authorizing Provider  oseltamivir (TAMIFLU) 75 MG capsule Take 1 capsule (75 mg total) by mouth every 12 (twelve) hours. 03/05/18   Coral Spikes, DO  valACYclovir (VALTREX) 1000 MG tablet Take 1 tablet (1,000 mg total) by mouth 3 (three) times daily. 03/05/18   Coral Spikes, DO    Allergies Patient has no known allergies.  No family history on file.  Social History Social History   Tobacco Use  . Smoking status: Current Some Day Smoker    Types: Cigarettes  . Smokeless tobacco: Never Used  Substance Use Topics  . Alcohol use: Yes    Alcohol/week: 24.0 standard drinks    Types: 24 Cans of beer per week  . Drug use: Yes    Types: Marijuana, Cocaine    Comment: 2 days ago    Review of Systems Constitutional: No fever/chills Eyes: No visual changes. ENT: No sore throat. Cardiovascular: Denies chest pain. Respiratory: Denies shortness of breath. Gastrointestinal: No abdominal  pain.  No nausea, no vomiting.  No diarrhea.  No constipation. Genitourinary: Negative for dysuria.  States brown color urine. Musculoskeletal: Positive for back and left leg pain. Skin: Negative for rash. Neurological: Negative for headaches, focal weakness or numbness.   ____________________________________________   PHYSICAL EXAM:  VITAL SIGNS: ED Triage Vitals  Enc Vitals Group     BP 06/04/19 1140 (!) 165/98     Pulse Rate 06/04/19 1140 82     Resp 06/04/19 1140 (!) 24     Temp 06/04/19 1140 98 F (36.7 C)     Temp Source 06/04/19 1140 Oral     SpO2 06/04/19 1140 99 %     Weight 06/04/19 1141 210 lb (95.3 kg)     Height 06/04/19 1141 5\' 9"  (1.753 m)     Head Circumference --      Peak Flow --      Pain Score 06/04/19 1141 10     Pain Loc --      Pain Edu? --      Excl. in [place]? --    Constitutional: Alert and oriented. Well appearing and in no acute distress. Neck: No stridor. No cervical spine tenderness to palpation. Hematological/Lymphatic/Immunilogical: No cervical lymphadenopathy. Cardiovascular: Normal rate, regular rhythm. Grossly normal heart sounds.  Good peripheral circulation. Respiratory: Normal respiratory effort.  No retractions. Lungs CTAB. Gastrointestinal: Soft and nontender. No distention. No abdominal bruits. No CVA tenderness. Genitourinary: Deferred Musculoskeletal: No lower extremity tenderness  nor edema.  No joint effusions. Neurologic:  Normal speech and language. No gross focal neurologic deficits are appreciated. No gait instability. Skin:  Skin is warm, dry and intact. No rash noted. Psychiatric: Mood and affect are normal. Speech and behavior are normal.  ____________________________________________   LABS (all labs ordered are listed, but only abnormal results are displayed)  Labs Reviewed  URINALYSIS, COMPLETE (UACMP) WITH MICROSCOPIC - Abnormal; Notable for the following components:      Result Value   Color, Urine AMBER (*)     APPearance CLOUDY (*)    Hgb urine dipstick LARGE (*)    Ketones, ur 20 (*)    Protein, ur 100 (*)    All other components within normal limits  CK - Abnormal; Notable for the following components:   Total CK 15,227 (*)    All other components within normal limits  BASIC METABOLIC PANEL - Abnormal; Notable for the following components:   CO2 20 (*)    Glucose, Bld 101 (*)    Creatinine, Ser 1.26 (*)    Anion gap 16 (*)    All other components within normal limits  CBC WITH DIFFERENTIAL/PLATELET - Abnormal; Notable for the following components:   WBC 12.0 (*)    Neutro Abs 9.6 (*)    All other components within normal limits  COMPREHENSIVE METABOLIC PANEL - Abnormal; Notable for the following components:   CO2 20 (*)    Creatinine, Ser 1.27 (*)    Calcium 8.7 (*)    AST 407 (*)    ALT 127 (*)    All other components within normal limits  SARS CORONAVIRUS 2 (TAT 6-24 HRS)   ____________________________________________  EKG   ____________________________________________  RADIOLOGY  ED MD interpretation:    Official radiology report(s): DG Lumbar Spine 2-3 Views  Result Date: 06/04/2019 CLINICAL DATA:  Left low back pain radiating to the left leg. EXAM: LUMBAR SPINE - 2-3 VIEW COMPARISON:  None. FINDINGS: There are 5 non rib-bearing lumbar type vertebrae. Lumbar vertebral alignment is normal. Partially visualized mild left convex curvature of the lower thoracic spine may be positional or reflect mild scoliosis. No fracture is identified. There is minimal endplate spurring in the lumbar and lower thoracic spine. Disc space heights are preserved. IMPRESSION: Minimal lumbar spondylosis without acute osseous abnormality. Electronically Signed   By: Sebastian Ache M.D.   On: 06/04/2019 13:43    ____________________________________________   PROCEDURES  Procedure(s) performed (including Critical Care):  Procedures   ____________________________________________   INITIAL  IMPRESSION / ASSESSMENT AND PLAN / ED COURSE  As part of my medical decision making, I reviewed the following data within the electronic MEDICAL RECORD NUMBER     Patient presents with back pain with radicular pain to the left lower extremity.  Patient also had a dark-colored urine.  Discussed no acute findings on x-ray of the lumbar spine.  Discussed lab results with patient consistent with rhabdomyolysis.  Consult generated to hospitalist for admission.   [employer]. was evaluated in Emergency Department on 06/04/2019 for the symptoms described in the history of present illness. He was evaluated in the context of the global COVID-19 pandemic, which necessitated consideration that the patient might be at risk for infection with the SARS-CoV-2 virus that causes COVID-19. Institutional protocols and algorithms that pertain to the evaluation of patients at risk for COVID-19 are in a state of rapid change based on information released by regulatory bodies including the CDC and federal and state organizations.  These policies and algorithms were followed during the patient's care in the ED.       ____________________________________________   FINAL CLINICAL IMPRESSION(S) / ED DIAGNOSES  Final diagnoses:  Non-traumatic rhabdomyolysis     ED Discharge Orders    None       Note:  This document was prepared using Dragon voice recognition software and may include unintentional dictation errors.    Joni Reining, PA-C 06/04/19 1624    Emily Filbert, MD 06/05/19 [phone]

## 2019-06-04 NOTE — ED Triage Notes (Signed)
Pt presents to ED via POV with c/o L lower back pain that radiates into his L leg. Pt states pain with sudden onset that started this morning. Pt states also when he urinates his urine is brown.

## 2019-06-04 NOTE — H&P (Signed)
History and Physical    [employer]. WNI:[id number] DOB: 1980/05/25 DOA: 06/04/2019  PCP: Patient, No Pcp Per  Patient coming from: home  I have personally briefly reviewed patient's old medical records in [place]  Chief Complaint: Low back pain, dark urine  HPI: Jonathan Mercer. is a 39 y.o. male with no significant past medical history presented to the ED this afternoon complaining of acute onset of low back pain that woke him up this morning.  Reports pain radiating down to the mid left lower extremity.  States his urine had turned brown-colored which he first noticed this morning.  States heavy alcohol use last night, but not chronically.  He did not find any aggravating or alleviating factors at home, rated severity of his pain 10 out of 10 in the ED.  He states this is never happened before.   ED Course: Tachypneic with respiratory rate 24 and hypertensive 165/98, afebrile.  Labs are notable for mild leukocytosis of 12.0k, creatinine 1.26 and total CK of 15,227.  UA was notable for large hemoglobin, ketones and protein, negative for infection.  X-ray of lumbar spine showed minimal lumbar spondylosis without acute findings.  Patient admitted for observation to hospitalist service for management of nontraumatic rhabdomyolysis.  Review of Systems: As per HPI otherwise 10 point review of systems negative.    History reviewed. No pertinent past medical history.  History reviewed. No pertinent surgical history.   reports that he has been smoking cigarettes. He has never used smokeless tobacco. He reports current alcohol use of about 24.0 standard drinks of alcohol per week. He reports current drug use. Drugs: Marijuana and Cocaine.  No Known Allergies  History reviewed. No pertinent family history.    Prior to Admission medications   Medication Sig Start Date End Date Taking? Authorizing Provider  oseltamivir (TAMIFLU) 75 MG capsule Take 1 capsule (75 mg total) by  mouth every 12 (twelve) hours. 03/05/18   Coral Spikes, DO  valACYclovir (VALTREX) 1000 MG tablet Take 1 tablet (1,000 mg total) by mouth 3 (three) times daily. 03/05/18   Coral Spikes, DO    Physical Exam: Vitals:   06/04/19 1140 06/04/19 1141  BP: (!) 165/98   Pulse: 82   Resp: (!) 24   Temp: 98 F (36.7 C)   TempSrc: Oral   SpO2: 99%   Weight:  95.3 kg  Height:  5\' 9"  (1.753 m)    Constitutional: NAD, calm, comfortable Eyes: PERRL, lids and conjunctivae normal ENMT: Mucous membranes are moist. Posterior pharynx clear of any exudate or lesions.Normal dentition.  Respiratory: CTAB, no wheezing, no crackles. Normal respiratory effort. No accessory muscle use.  Cardiovascular: RRR, no murmurs / rubs / gallops. No extremity edema.   Abdomen: soft, NT, ND, no masses or HSM palpated. +Bowel sounds.  Musculoskeletal: no clubbing / cyanosis. No joint deformity upper and lower extremities. Normal muscle tone.  Skin: dry, intact, normal color, normal temperature Neurologic: CN 2-12 grossly intact. Normal speech.  Grossly non-focal exam. Psychiatric: Alert and oriented x 3. Normal mood. Congruent affect.  Normal judgement and insight.   Labs on Admission: I have personally reviewed following labs and imaging studies  CBC: Recent Labs  Lab 06/04/19 1152  WBC 12.0*  NEUTROABS 9.6*  HGB 14.6  HCT 43.7  MCV 86.4  PLT 381   Basic Metabolic Panel: Recent Labs  Lab 06/04/19 1152 06/04/19 1444  NA 138 138  K 4.4 4.1  CL 102 105  CO2 20* 20*  GLUCOSE 101* 93  BUN 13 12  CREATININE 1.26* 1.27*  CALCIUM 9.4 8.7*   GFR: Estimated Creatinine Clearance: 88 mL/min (A) (by C-G formula based on SCr of 1.27 mg/dL (H)). Liver Function Tests: Recent Labs  Lab 06/04/19 1444  AST 407*  ALT 127*  ALKPHOS 55  BILITOT 1.1  PROT 7.9  ALBUMIN 4.2   No results for input(s): LIPASE, AMYLASE in the last 168 hours. No results for input(s): AMMONIA in the last 168 hours. Coagulation  Profile: No results for input(s): INR, PROTIME in the last 168 hours. Cardiac Enzymes: Recent Labs  Lab 06/04/19 1152  CKTOTAL 15,227*   BNP (last 3 results) No results for input(s): PROBNP in the last 8760 hours. HbA1C: No results for input(s): HGBA1C in the last 72 hours. CBG: No results for input(s): GLUCAP in the last 168 hours. Lipid Profile: No results for input(s): CHOL, HDL, LDLCALC, TRIG, CHOLHDL, LDLDIRECT in the last 72 hours. Thyroid Function Tests: No results for input(s): TSH, T4TOTAL, FREET4, T3FREE, THYROIDAB in the last 72 hours. Anemia Panel: No results for input(s): VITAMINB12, FOLATE, FERRITIN, TIBC, IRON, RETICCTPCT in the last 72 hours. Urine analysis:    Component Value Date/Time   COLORURINE AMBER (A) 06/04/2019 1400   APPEARANCEUR CLOUDY (A) 06/04/2019 1400   LABSPEC 1.013 06/04/2019 1400   PHURINE 5.0 06/04/2019 1400   GLUCOSEU NEGATIVE 06/04/2019 1400   HGBUR LARGE (A) 06/04/2019 1400   BILIRUBINUR NEGATIVE 06/04/2019 1400   KETONESUR 20 (A) 06/04/2019 1400   PROTEINUR 100 (A) 06/04/2019 1400   NITRITE NEGATIVE 06/04/2019 1400   LEUKOCYTESUR NEGATIVE 06/04/2019 1400    Radiological Exams on Admission: DG Lumbar Spine 2-3 Views  Result Date: 06/04/2019 CLINICAL DATA:  Left low back pain radiating to the left leg. EXAM: LUMBAR SPINE - 2-3 VIEW COMPARISON:  None. FINDINGS: There are 5 non rib-bearing lumbar type vertebrae. Lumbar vertebral alignment is normal. Partially visualized mild left convex curvature of the lower thoracic spine may be positional or reflect mild scoliosis. No fracture is identified. There is minimal endplate spurring in the lumbar and lower thoracic spine. Disc space heights are preserved. IMPRESSION: Minimal lumbar spondylosis without acute osseous abnormality. Electronically Signed   By: Sebastian Ache M.D.   On: 06/04/2019 13:43     Assessment/Plan Principal Problem:   Rhabdomyolysis Active Problems:   AKI (acute kidney  injury) (HCC)   Rhabdomyolysis Acute Kidney Injury, secondary to rhabdo most likely Total CK > 15000 on admission.  Got 1 L NS bolus in the ED. --continue NS @ 150 cc/hr --recheck BMP and CK in the AM --avoid nephrotoxins and renally dose meds as indicated   Hypertension - no prior diagnosis, but hypertensive in the ED. --PRN oral hydralazine --monitor BP and consider initiating medication on discharge if indicated, PCP follow up   DVT prophylaxis: Heparin  Code Status: Full  Family Communication: none at bedside during encounter  Disposition Plan: expect discharge home in 24-28 hours  Consults called: none  Admission status: obs    Pennie Banter, DO [facility]   If 7PM-7AM, please contact night-coverage [email or web address]  06/04/2019, 4:33 PM

## 2019-06-04 NOTE — ED Notes (Signed)
Pt resting in NAD. Denies further needs. Waiting on admitting MD.

## 2019-06-05 ENCOUNTER — Observation Stay: Payer: Self-pay

## 2019-06-05 LAB — BASIC METABOLIC PANEL
Anion gap: 7 (ref 5–15)
BUN: 14 mg/dL (ref 6–20)
CO2: 26 mmol/L (ref 22–32)
Calcium: 8.6 mg/dL — ABNORMAL LOW (ref 8.9–10.3)
Chloride: 107 mmol/L (ref 98–111)
Creatinine, Ser: 1.25 mg/dL — ABNORMAL HIGH (ref 0.61–1.24)
GFR calc Af Amer: >60 mL/min (ref >60)
GFR calc non Af Amer: >60 mL/min (ref >60)
Glucose, Bld: 123 mg/dL — ABNORMAL HIGH (ref 70–99)
Potassium: 3.6 mmol/L (ref 3.5–5.1)
Sodium: 140 mmol/L (ref 135–145)

## 2019-06-05 LAB — CK: Total CK: 33582 U/L — ABNORMAL HIGH (ref 49–397)

## 2019-06-05 LAB — CBC
HCT: 39.9 % (ref 39.0–52.0)
Hemoglobin: 13.2 g/dL (ref 13.0–17.0)
MCH: 28.9 pg (ref 26.0–34.0)
MCHC: 33.1 g/dL (ref 30.0–36.0)
MCV: 87.3 fL (ref 80.0–100.0)
Platelets: 263 10*3/uL (ref 150–400)
RBC: 4.57 MIL/uL (ref 4.22–5.81)
RDW: 13.9 % (ref 11.5–15.5)
WBC: 7.5 10*3/uL (ref 4.0–10.5)
nRBC: 0 % (ref 0.0–0.2)

## 2019-06-05 MED ORDER — PREDNISONE 20 MG PO TABS
60.0000 mg | ORAL_TABLET | Freq: Every day | ORAL | Status: DC
  Administered 2019-06-05 – 2019-06-07 (×3): 60 mg via ORAL
  Filled 2019-06-05 (×3): qty 3

## 2019-06-05 MED ORDER — OXYCODONE-ACETAMINOPHEN 7.5-325 MG PO TABS
1.0000 | ORAL_TABLET | ORAL | Status: DC | PRN
  Administered 2019-06-05 – 2019-06-06 (×4): 1 via ORAL
  Filled 2019-06-05 (×4): qty 1

## 2019-06-05 MED ORDER — POLYETHYLENE GLYCOL 3350 17 G PO PACK
17.0000 g | PACK | Freq: Every day | ORAL | Status: DC
  Administered 2019-06-05 – 2019-06-07 (×3): 17 g via ORAL
  Filled 2019-06-05 (×3): qty 1

## 2019-06-05 MED ORDER — MAGNESIUM CITRATE PO SOLN
1.0000 | Freq: Once | ORAL | Status: AC
  Administered 2019-06-05: 18:00:00 1 via ORAL
  Filled 2019-06-05 (×2): qty 296

## 2019-06-05 MED ORDER — BISACODYL 5 MG PO TBEC
5.0000 mg | DELAYED_RELEASE_TABLET | Freq: Every day | ORAL | Status: DC | PRN
  Administered 2019-06-05 – 2019-06-07 (×2): 5 mg via ORAL
  Filled 2019-06-05 (×2): qty 1

## 2019-06-05 NOTE — Progress Notes (Signed)
I have reviewed the MRI of the lumbar spine.  Findings consistent with rhabdomyolysis.  No canal stenosis or neural impingement seen.  No surgical intervention necessary.  Continue prednisone and PT.     VITALS:   Vitals:   06/04/19 1749 06/04/19 2100 06/05/19 0455 06/05/19 1203  BP:  (!) 144/85 121/72 (!) 146/90  Pulse:  75 92 82  Resp:  20 20   Temp:  98.6 F (37 C) 97.7 F (36.5 C) (!) 97.5 F (36.4 C)  TempSrc:  Oral Oral Oral  SpO2:  100% 100% 97%  Weight: 94.3 kg     Height: 5\' 9"  (1.753 m)      LABS  Results for orders placed or performed during the hospital encounter of 06/04/19 (from the past 24 hour(s))  Basic metabolic panel     Status: Abnormal   Collection Time: 06/05/19  5:29 AM  Result Value Ref Range   Sodium 140 135 - 145 mmol/L   Potassium 3.6 3.5 - 5.1 mmol/L   Chloride 107 98 - 111 mmol/L   CO2 26 22 - 32 mmol/L   Glucose, Bld 123 (H) 70 - 99 mg/dL   BUN 14 6 - 20 mg/dL   Creatinine, Ser 06/07/19 (H) 0.61 - 1.24 mg/dL   Calcium 8.6 (L) 8.9 - 10.3 mg/dL   GFR calc non Af Amer >60 >60 mL/min   GFR calc Af Amer >60 >60 mL/min   Anion gap 7 5 - 15  CBC     Status: None   Collection Time: 06/05/19  5:29 AM  Result Value Ref Range   WBC 7.5 4.0 - 10.5 K/uL   RBC 4.57 4.22 - 5.81 MIL/uL   Hemoglobin 13.2 13.0 - 17.0 g/dL   HCT 06/07/19 60.6 - 30.1 %   MCV 87.3 80.0 - 100.0 fL   MCH 28.9 26.0 - 34.0 pg   MCHC 33.1 30.0 - 36.0 g/dL   RDW 60.1 09.3 - 23.5 %   Platelets 263 150 - 400 K/uL   nRBC 0.0 0.0 - 0.2 %  CK     Status: Abnormal   Collection Time: 06/05/19  5:29 AM  Result Value Ref Range   Total CK 33,582 (H) 49 - 397 U/L    DG Lumbar Spine 2-3 Views  Result Date: 06/04/2019 CLINICAL DATA:  Left low back pain radiating to the left leg. EXAM: LUMBAR SPINE - 2-3 VIEW COMPARISON:  None. FINDINGS: There are 5 non rib-bearing lumbar type vertebrae. Lumbar vertebral alignment is normal. Partially visualized mild left convex curvature of the lower  thoracic spine may be positional or reflect mild scoliosis. No fracture is identified. There is minimal endplate spurring in the lumbar and lower thoracic spine. Disc space heights are preserved. IMPRESSION: Minimal lumbar spondylosis without acute osseous abnormality. Electronically Signed   By: 06/06/2019 M.D.   On: 06/04/2019 13:43   MR LUMBAR SPINE WO CONTRAST  Result Date: 06/05/2019 CLINICAL DATA:  Acute onset low back pain with left-sided radiculopathy EXAM: MRI LUMBAR SPINE WITHOUT CONTRAST TECHNIQUE: Multiplanar, multisequence MR imaging of the lumbar spine was performed. No intravenous contrast was administered. COMPARISON:  X-ray 06/04/2019 FINDINGS: Segmentation:  Standard. Alignment:  Physiologic. Vertebrae:  No fracture, evidence of discitis, or bone lesion. Conus medullaris and cauda equina: Conus extends to the L1 level. Conus and cauda equina appear normal. Paraspinal and other soft tissues: Extensive edema-like intramuscular signal within the posterior paraspinal musculature of the mid to lower lumbar spine, left  slightly worse than right. There is also T2 signal changes within the visualized portion of the left gluteus medius muscle belly. No intramuscular fluid collection. Disc levels: T12-L1: Unremarkable. L1-L2: Minimal disc height loss and diffuse disc bulge without foraminal or canal stenosis. L2-L3: Unremarkable. L3-L4: Unremarkable. L4-L5: Unremarkable. L5-S1: Disc desiccation with small central disc protrusion contacts and slightly displaces the descending S1 nerve roots, right slightly greater than left. No canal stenosis. No foraminal stenosis. IMPRESSION: 1. Extensive edema-like intramuscular signal within the posterior paraspinal musculature of the mid to lower lumbar spine, left slightly worse than right. Findings are nonspecific but can be seen in the setting of muscle strain/overuse, rhabdomyolysis, versus myositis. No intramuscular fluid collection to suggest myonecrosis.  Similar findings are also present within the visualized left gluteus medius muscle. 2. No significant canal stenosis or evidence of neural impingement of the lumbar spine. 3. Small central disc protrusion at L5-S1 contacts and slightly displaces the descending S1 nerve roots, right slightly greater than left. Electronically Signed   By: Davina Poke D.O.   On: 06/05/2019 13:44     [place] , MD 06/05/2019, 6:40 PM

## 2019-06-05 NOTE — Progress Notes (Addendum)
PROGRESS NOTE    Jonathan Mercer.  LFY:[id number] DOB: [birth year] DOA: 06/04/2019  PCP: Patient, No Pcp Per    LOS - 0   Brief Narrative:  Jonathan Mercer. is a 39 y.o. male with no past medical history other than substance abuse, who presented to the ED on 2/23 complaining of acute onset of low back pain that woke him up this morning.  Pain radiating down to the mid left lower extremity, and so severe he cannot tolerate standing or walking.  Patient also reported dark brown-colored urine which he first noticed earlier that morning.  States heavy alcohol use the night before, but no trauma or excessive physical exertion.  In the ED, hypertensive, labs showed AKI and rhabdomyolysis with CK over 15k.  Xray lumbar spine showed minimal lumbar spondylosis without acute findings.  Patient admitted for observation to hospitalist service for management of nontraumatic rhabdomyolysis.     Subjective 2/24: Patient reports ongoing severe back pain.  States oral pain medicine helps the leg symptoms, but so far only morphine has helped the back.  He is in too much pain to stand or walk.  Denies saddle anesthesia or changes in bowel or bladder function, but does endorse constipation.  No fever/chills, chest pain, SOB, N/V/D or other acute complaints.  Assessment & Plan:   Principal Problem:   Rhabdomyolysis Active Problems:   AKI (acute kidney injury) (HCC)   Rhabdomyolysis Acute Kidney Injury, secondary to rhabdo most likely Total CK > 15000 on admission.  Got 1 L NS bolus in the ED.  No recent trauma, excessive exertion, or new medications.  Suspect this is due to alcohol/drug abuse. CK this AM up over 33k and renal function essentially unchanged.   --continue NS @ 150 cc/hr --recheck BMP and CK in the AM --avoid nephrotoxins and renally dose meds as indicated  Acute Low Back Pain with Radiculopathy - without trauma or known injury, patient woke up in severe pain morning of 2/23 after heavy  drinking night before.  Unable to ambulate.  Differential includes disc herniation, foraminal stenosis less likely with acute onset, infection unlikely as patient afebrile and only minimal leukocytosis that resolved. --start Prednisone 60 mg daily, plan to d/c on Medrol dose pack --PT evaluation pending --ortho consulted, appreciate recs --MRI lumbar spine - pending, to evaluate for disc herniation --pain management per orders --bowel regimen while on pain meds ----------------------------------------------------- ADDENDUM - MRI LUMBAR SPINE: IMPRESSION: 1. Extensive edema-like intramuscular signal within the posterior paraspinal musculature of the mid to lower lumbar spine, left slightly worse than right. Findings are nonspecific but can be seen in the setting of muscle strain/overuse, rhabdomyolysis, versus myositis. No intramuscular fluid collection to suggest myonecrosis. Similar findings are also present within the visualized left gluteus medius muscle. 2. No significant canal stenosis or evidence of neural impingement of the lumbar spine. 3. Small central disc protrusion at L5-S1 contacts and slightly displaces the descending S1 nerve roots, right slightly greater than left. ---------------------------------------------------- Transaminitis - mostly likely due to EtOH abuse and/or rhabdomyolysis.  On admission, AST 407, ALT 127, other LFT's within normal limits. --RUQ ultrasound  --hepatitis panel with AM labs  Hypertension - no prior diagnosis, but hypertensive in the ED at 165/98. --PRN oral hydralazine --monitor BP and consider initiating medication on discharge if indicated, PCP follow up   DVT prophylaxis: hepain   Code Status: Full Code  Family Communication: wife(?) at bedside during encounter this AM   Disposition Plan:  Expect discharge home  in 24-48 hours pending improvement in pain (able to ambulate and off IV pain medicine) and PT evaluation. Coming From: Home,  fully independent at baseline Exp DC Date 2/25 Barriers clinical improvement & evaluation as above Medically Stable for Discharge? No    Severity of Illness: The appropriate patient status for this patient is INPATIENT. It is not anticipated that the patient will be medically stable for discharge from the hospital within 2 midnights of admission. The following factors support the patient status of inpatient.  "           Presenting symptoms include acute onset low back pain with radiation down the left lower extremity. "           Worrisome physical exam findings include exacerbated pain on passive leg raise, inability to ambulate. "           Initial radiographic and laboratory data are worrisome because of CK continues increasing. "           Chronic co-morbidities include substance abuse.   * I certify that at the point of admission it is my clinical judgment that the patient will require inpatient hospital care spanning beyond 2 midnights from the point of admission due to high intensity of service, high risk for further deterioration and high frequency of surveillance required.*   Consultants:   Orthopedics  Procedures:   None  Antimicrobials:   None    Objective: Vitals:   06/04/19 1749 06/04/19 2100 06/05/19 0455 06/05/19 1203  BP:  (!) 144/85 121/72 (!) 146/90  Pulse:  75 92 82  Resp:  20 20   Temp:  98.6 F (37 C) 97.7 F (36.5 C) (!) 97.5 F (36.4 C)  TempSrc:  Oral Oral Oral  SpO2:  100% 100% 97%  Weight: 94.3 kg     Height: 5\' 9"  (1.753 m)       Intake/Output Summary (Last 24 hours) at 06/05/2019 1327 Last data filed at 06/05/2019 06/07/2019 Gross per 24 hour  Intake 2660.12 ml  Output 500 ml  Net 2160.12 ml   Filed Weights   06/04/19 1141 06/04/19 1749  Weight: 95.3 kg 94.3 kg    Examination:  General exam: awake, alert, no acute distress HEENT: atraumatic, clear conjunctiva, anicteric sclera, moist mucus membranes, hearing grossly normal  Respiratory  system: CTAB, no wheezes, rales or rhonchi, normal respiratory effort. Cardiovascular system: normal S1/S2, RRR, no JVD, murmurs, rubs, gallops, no pedal edema.   Gastrointestinal system: soft, mildly tender without guarding or rebound, +bowel sounds. Central nervous system: A&O x4. no gross focal neurologic deficits, normal speech Extremities: moves all, no edema, normal tone Skin: dry, intact, normal temperature, normal color Psychiatry: normal mood, congruent affect, judgement and insight appear normal    Data Reviewed: I have personally reviewed following labs and imaging studies  CBC: Recent Labs  Lab 06/04/19 1152 06/04/19 1742 06/05/19 0529  WBC 12.0* 11.4* 7.5  NEUTROABS 9.6*  --   --   HGB 14.6 14.3 13.2  HCT 43.7 42.2 39.9  MCV 86.4 86.3 87.3  PLT 303 313 263   Basic Metabolic Panel: Recent Labs  Lab 06/04/19 1152 06/04/19 1444 06/04/19 1742 06/05/19 0529  NA 138 138  --  140  K 4.4 4.1  --  3.6  CL 102 105  --  107  CO2 20* 20*  --  26  GLUCOSE 101* 93  --  123*  BUN 13 12  --  14  CREATININE 1.26* 1.27*  1.33* 1.25*  CALCIUM 9.4 8.7*  --  8.6*   GFR: Estimated Creatinine Clearance: 89 mL/min (A) (by C-G formula based on SCr of 1.25 mg/dL (H)). Liver Function Tests: Recent Labs  Lab 06/04/19 1444  AST 407*  ALT 127*  ALKPHOS 55  BILITOT 1.1  PROT 7.9  ALBUMIN 4.2   No results for input(s): LIPASE, AMYLASE in the last 168 hours. No results for input(s): AMMONIA in the last 168 hours. Coagulation Profile: No results for input(s): INR, PROTIME in the last 168 hours. Cardiac Enzymes: Recent Labs  Lab 06/04/19 1152 06/05/19 0529  CKTOTAL 15,227* 33,582*   BNP (last 3 results) No results for input(s): PROBNP in the last 8760 hours. HbA1C: No results for input(s): HGBA1C in the last 72 hours. CBG: No results for input(s): GLUCAP in the last 168 hours. Lipid Profile: No results for input(s): CHOL, HDL, LDLCALC, TRIG, CHOLHDL, LDLDIRECT in the  last 72 hours. Thyroid Function Tests: No results for input(s): TSH, T4TOTAL, FREET4, T3FREE, THYROIDAB in the last 72 hours. Anemia Panel: No results for input(s): VITAMINB12, FOLATE, FERRITIN, TIBC, IRON, RETICCTPCT in the last 72 hours. Sepsis Labs: No results for input(s): PROCALCITON, LATICACIDVEN in the last 168 hours.  Recent Results (from the past 240 hour(s))  SARS CORONAVIRUS 2 (TAT 6-24 HRS) Nasopharyngeal Nasopharyngeal Swab     Status: None   Collection Time: 06/04/19  4:08 PM   Specimen: Nasopharyngeal Swab  Result Value Ref Range Status   SARS Coronavirus 2 NEGATIVE NEGATIVE Final    Comment: (NOTE) SARS-CoV-2 target nucleic acids are NOT DETECTED. The SARS-CoV-2 RNA is generally detectable in upper and lower respiratory specimens during the acute phase of infection. Negative results do not preclude SARS-CoV-2 infection, do not rule out co-infections with other pathogens, and should not be used as the sole basis for treatment or other patient management decisions. Negative results must be combined with clinical observations, patient history, and epidemiological information. The expected result is Negative. Fact Sheet for Patients: HairSlick.no Fact Sheet for Healthcare Providers: quierodirigir.com This test is not yet approved or cleared by the [place] FDA and  has been authorized for detection and/or diagnosis of SARS-CoV-2 by FDA under an Emergency Use Authorization (EUA). This EUA will remain  in effect (meaning this test can be used) for the duration of the COVID-19 declaration under Section 56 4(b)(1) of the Act, 21 U.S.C. section 360bbb-3(b)(1), unless the authorization is terminated or revoked sooner. Performed at [facility], [address], [place], [place] [address]          Radiology Studies: DG Lumbar Spine 2-3 Views  Result Date: 06/04/2019 CLINICAL DATA:  Left low back pain  radiating to the left leg. EXAM: LUMBAR SPINE - 2-3 VIEW COMPARISON:  None. FINDINGS: There are 5 non rib-bearing lumbar type vertebrae. Lumbar vertebral alignment is normal. Partially visualized mild left convex curvature of the lower thoracic spine may be positional or reflect mild scoliosis. No fracture is identified. There is minimal endplate spurring in the lumbar and lower thoracic spine. Disc space heights are preserved. IMPRESSION: Minimal lumbar spondylosis without acute osseous abnormality. Electronically Signed   By: Sebastian Ache M.D.   On: 06/04/2019 13:43        Scheduled Meds: . heparin  5,000 Units Subcutaneous Q8H  . predniSONE  60 mg Oral Q breakfast   Continuous Infusions: . sodium chloride 150 mL/hr at 06/05/19 0937     LOS: 0 days    Time spent:  40 minutes    Ezekiel Slocumb, DO [facility]   If 7PM-7AM, please contact night-coverage [email or web address] 06/05/2019, 1:27 PM

## 2019-06-05 NOTE — Consult Note (Signed)
ORTHOPAEDIC CONSULTATION  REQUESTING PHYSICIAN: Ezekiel Slocumb, DO  Chief Complaint: Low back pain with paresthesias in the left lower extremity  HPI: Jonathan Moist. is a 39 y.o. male who states he woke up yesterday with severe pain in the lumbar spine.  He reported significant alcohol intact the night before.  Patient states he has paresthesias in the left thigh and he has been unable to stand or walk.  History reviewed. No pertinent past medical history. History reviewed. No pertinent surgical history. Social History   Socioeconomic History  . Marital status: Single    Spouse name: Not on file  . Number of children: Not on file  . Years of education: Not on file  . Highest education level: Not on file  Occupational History  . Not on file  Tobacco Use  . Smoking status: Current Some Day Smoker    Types: Cigarettes  . Smokeless tobacco: Never Used  Substance and Sexual Activity  . Alcohol use: Yes    Alcohol/week: 24.0 standard drinks    Types: 24 Cans of beer per week  . Drug use: Yes    Types: Marijuana, Cocaine    Comment: 2 days ago  . Sexual activity: Not on file  Other Topics Concern  . Not on file  Social History Narrative  . Not on file   Social Determinants of Health   Financial Resource Strain:   . Difficulty of Paying Living Expenses: Not on file  Food Insecurity:   . Worried About Charity fundraiser in the Last Year: Not on file  . Ran Out of Food in the Last Year: Not on file  Transportation Needs:   . Lack of Transportation (Medical): Not on file  . Lack of Transportation (Non-Medical): Not on file  Physical Activity:   . Days of Exercise per Week: Not on file  . Minutes of Exercise per Session: Not on file  Stress:   . Feeling of Stress : Not on file  Social Connections:   . Frequency of Communication with Friends and Family: Not on file  . Frequency of Social Gatherings with Friends and Family: Not on file  . Attends Religious  Services: Not on file  . Active Member of Clubs or Organizations: Not on file  . Attends Archivist Meetings: Not on file  . Marital Status: Not on file   History reviewed. No pertinent family history. No Known Allergies Prior to Admission medications   Not on File   DG Lumbar Spine 2-3 Views  Result Date: 06/04/2019 CLINICAL DATA:  Left low back pain radiating to the left leg. EXAM: LUMBAR SPINE - 2-3 VIEW COMPARISON:  None. FINDINGS: There are 5 non rib-bearing lumbar type vertebrae. Lumbar vertebral alignment is normal. Partially visualized mild left convex curvature of the lower thoracic spine may be positional or reflect mild scoliosis. No fracture is identified. There is minimal endplate spurring in the lumbar and lower thoracic spine. Disc space heights are preserved. IMPRESSION: Minimal lumbar spondylosis without acute osseous abnormality. Electronically Signed   By: Logan Bores M.D.   On: 06/04/2019 13:43    Positive ROS: All other systems have been reviewed and were otherwise negative with the exception of those mentioned in the HPI and as above.  Physical Exam: General: Alert, no acute distress  MUSCULOSKELETAL: Patient with lumbar pain and tenderness extending to the left side.  Patient has no pain with ER/IR of the left hip with logrolling.  He can flex  and extend both knees, hips and ankles with pain on the left side.  He has palpable pedal pulses.   His lower extremity compartments are soft and compressible.    Assessment: Acute onset of low back pain, left lower extremity paresthesias and inability to walk concerning for herniated disc   Plan: Patient has been started on prednisone by Dr. Denton Lank.  PT evaluation is ordered, will review PT notes once done.  MRI of lumbar spine is ordered to evaluate for herniated disc vs. other spinal pathology.  Will follow review MRI once completed.      Jonathan Fairly, MD    06/05/2019 1:21 PM

## 2019-06-05 NOTE — Evaluation (Signed)
Physical Therapy Evaluation Patient Details Name: Jonathan Mercer. MRN: [id number] DOB: Sep 04, 1980 Today's Date: 06/05/2019   History of Present Illness  Jonathan Mercer is a 19yoM who [facility] on 2/23 after awakeneing with severe low back and Left hip pain. Pt has been unable to tolerate standing/AMB. Labs reveal AKI, rhabdomyolysis c CK >15k. MRI lumbar spine showing "Extensive edema-like intramuscular signal within the posterior paraspinal musculature of the mid to lower lumbar spine, left slightly worse than right. There is also T2 signal changes within the visualized portion of the left gluteus medius muscle belly,...no central canal stenois,.Marland KitchenMarland KitchenSmall central disc protrusion at L5-S1 contacts and slightly displaces the descending S1 nerve roots, right slightly greater than left." PTA pt reports no difficulty with mobility.  Clinical Impression  Pt admitted with above diagnosis. Pt currently with functional limitations due to the deficits listed below (see "PT Problem List"). Upon entry, pt in bed, awake and agreeable to participate, reports to be in severe pain, somewhat improved when not moving and when on 'strong meds,' but denies any antalgia from steroids. The pt is alert and oriented x3, pleasant, conversational, and generally a good historian. Pt reports no prior low back pain history, no prior similar episode. Pain distribution is splint in two regions: the first is splint across the anterior skin crease across the hip joint and then laterally around the greater trochanter, the latter with increased firmness and numbness pt pt report. These correlate well with MRI findings of gluteus medius pathology, poor corotation with radicular or discogenic pain. The second area is pain at bilat low back near lumbosacral junction with larger distribution of the left paraspinals up to ~T9 level. MinA for logroll to EOB, Supervision for severely pain limited STS transfer. Pt able to slowly AMB ~185ft c  RW, grimacing and moaning throughout. Pt declines attempting stairs until pain better controlled. Functional mobility assessment demonstrates increased effort/time requirements, poor tolerance, and need for physical assistance, whereas the patient performed these at a higher level of independence PTA. In current state, pt would be able to safely navigate the home, however entry is complicated by 22 contiguous stairs to enter and pt has been unable to AMB any stairs hitherto due to pain. Pt will benefit from skilled PT intervention to increase independence and safety with basic mobility in preparation for discharge to the venue listed below.       Follow Up Recommendations Home health PT;Supervision - Intermittent    Equipment Recommendations  Rolling walker with 5" wheels;3in1 (PT);Other (comment)(may need EMS transport back into house.)    Recommendations for Other Services       Precautions / Restrictions Precautions Precautions: None Restrictions Weight Bearing Restrictions: No      Mobility  Bed Mobility Overal bed mobility: Needs Assistance Bed Mobility: Supine to Sit;Sit to Supine     Supine to sit: Min assist Sit to supine: Min assist   General bed mobility comments: educated on log roll technique for pain control  Transfers Overall transfer level: Needs assistance Equipment used: None Transfers: Sit to/from Stand Sit to Stand: Supervision         General transfer comment: slow and painfull like a competition 1RM squat  Ambulation/Gait Ambulation/Gait assistance: Supervision Gait Distance (Feet): 120 Feet Assistive device: Rolling walker (2 wheeled) Gait Pattern/deviations: Step-to pattern     General Gait Details: 3-point gait with heavy offset through RW; weight bearing through BUE on RW creates some back traction which pt reports is favorable to  his pain. Pt is very guarded in trunk posturing, unable to stand fully upright.(Distance limited by author d/t pain  issues and lack of mask in hallway, however very pain limited.)  Stairs Stairs: (pt declined until pt is better controlled)          Wheelchair Mobility    Modified Rankin (Stroke Patients Only)       Balance Overall balance assessment: Modified Independent                                           Pertinent Vitals/Pain Pain Assessment: 0-10 Pain Score: 9  Pain Location: Left paraspinal area ilium to T9 level; Right focal low back pain; Left groin (crease area) and trochanteric area. Pain Descriptors / Indicators: Stabbing;Other (Comment)(stinging) Pain Intervention(s): Limited activity within patient's tolerance;RN gave pain meds during session;Monitored during session    Home Living Family/patient expects to be discharged to:: Private residence Living Arrangements: Spouse/significant other;Children(Wife, 5yoDTR) Available Help at Discharge: Family Type of Home: House Home Access: Stairs to enter Entrance Stairs-Rails: Lawyer of Steps: 3+22 Home Layout: One level Home Equipment: None      Prior Function Level of Independence: Independent               Hand Dominance        Extremity/Trunk Assessment   Upper Extremity Assessment Upper Extremity Assessment: Overall WFL for tasks assessed    Lower Extremity Assessment Lower Extremity Assessment: Overall WFL for tasks assessed       Communication   Communication: No difficulties  Cognition Arousal/Alertness: Awake/alert Behavior During Therapy: WFL for tasks assessed/performed Overall Cognitive Status: Within Functional Limits for tasks assessed                                        General Comments      Exercises     Assessment/Plan    PT Assessment Patient needs continued PT services  PT Problem List Decreased strength;Decreased cognition;Decreased range of motion;Decreased activity tolerance;Decreased balance;Decreased  mobility;Pain       PT Treatment Interventions DME instruction;Gait training;Stair training;Functional mobility training;Therapeutic activities;Therapeutic exercise;Patient/family education    PT Goals (Current goals can be found in the Care Plan section)  Acute Rehab PT Goals Patient Stated Goal: decrease pain, be able to access home. PT Goal Formulation: With patient Time For Goal Achievement: 06/19/19 Potential to Achieve Goals: Good    Frequency 7X/week   Barriers to discharge Inaccessible home environment(22 stairs to enter)      Co-evaluation               AM-PAC PT "6 Clicks" Mobility  Outcome Measure Help needed turning from your back to your side while in a flat bed without using bedrails?: A Little Help needed moving from lying on your back to sitting on the side of a flat bed without using bedrails?: A Little Help needed moving to and from a bed to a chair (including a wheelchair)?: A Little Help needed standing up from a chair using your arms (e.g., wheelchair or bedside chair)?: A Little Help needed to walk in hospital room?: A Little Help needed climbing 3-5 steps with a railing? : A Lot 6 Click Score: 17    End of Session   Activity Tolerance: Patient limited  by pain Patient left: in bed;with call bell/phone within reach Nurse Communication: Mobility status;Precautions PT Visit Diagnosis: Difficulty in walking, not elsewhere classified (R26.2);Pain;Other abnormalities of gait and mobility (R26.89) Pain - Right/Left: Left Pain - part of body: Hip(back)    Time: 1415-1440 PT Time Calculation (min) (ACUTE ONLY): 25 min   Charges:   PT Evaluation $PT Eval Low Complexity: 1 Low PT Treatments $Therapeutic Exercise: 8-22 mins        3:08 PM, 06/05/19 Rosamaria Lints, PT, DPT Physical Therapist - [facility]  [phone] (ASCOM)   Gaby Harney C 06/05/2019, 2:59 PM

## 2019-06-06 ENCOUNTER — Inpatient Hospital Stay: Payer: Self-pay

## 2019-06-06 LAB — HEPATITIS PANEL, ACUTE
HCV Ab: NONREACTIVE
Hep A IgM: NONREACTIVE
Hep B C IgM: NONREACTIVE
Hepatitis B Surface Ag: NONREACTIVE

## 2019-06-06 LAB — COMPREHENSIVE METABOLIC PANEL
ALT: 248 U/L — ABNORMAL HIGH (ref 0–44)
AST: 549 U/L — ABNORMAL HIGH (ref 15–41)
Albumin: 3.5 g/dL (ref 3.5–5.0)
Alkaline Phosphatase: 49 U/L (ref 38–126)
Anion gap: 9 (ref 5–15)
BUN: 8 mg/dL (ref 6–20)
CO2: 26 mmol/L (ref 22–32)
Calcium: 8.7 mg/dL — ABNORMAL LOW (ref 8.9–10.3)
Chloride: 105 mmol/L (ref 98–111)
Creatinine, Ser: 1.1 mg/dL (ref 0.61–1.24)
GFR calc Af Amer: >60 mL/min (ref >60)
GFR calc non Af Amer: >60 mL/min (ref >60)
Glucose, Bld: 104 mg/dL — ABNORMAL HIGH (ref 70–99)
Potassium: 3.7 mmol/L (ref 3.5–5.1)
Sodium: 140 mmol/L (ref 135–145)
Total Bilirubin: 0.9 mg/dL (ref 0.3–1.2)
Total Protein: 7.4 g/dL (ref 6.5–8.1)

## 2019-06-06 LAB — CK: Total CK: 30362 U/L — ABNORMAL HIGH (ref 49–397)

## 2019-06-06 MED ORDER — METHOCARBAMOL 500 MG PO TABS
1000.0000 mg | ORAL_TABLET | Freq: Four times a day (QID) | ORAL | Status: DC
  Administered 2019-06-06 – 2019-06-07 (×4): 1000 mg via ORAL
  Filled 2019-06-06 (×3): qty 2

## 2019-06-06 MED ORDER — SIMETHICONE 80 MG PO CHEW
80.0000 mg | CHEWABLE_TABLET | Freq: Four times a day (QID) | ORAL | Status: DC | PRN
  Administered 2019-06-06: 11:00:00 80 mg via ORAL
  Filled 2019-06-06: qty 1

## 2019-06-06 MED ORDER — SENNA 8.6 MG PO TABS
1.0000 | ORAL_TABLET | Freq: Once | ORAL | Status: AC
  Administered 2019-06-06: 11:00:00 8.6 mg via ORAL
  Filled 2019-06-06: qty 1

## 2019-06-06 MED ORDER — MELOXICAM 7.5 MG PO TABS
15.0000 mg | ORAL_TABLET | Freq: Every day | ORAL | Status: DC
  Administered 2019-06-06 – 2019-06-07 (×2): 15 mg via ORAL
  Filled 2019-06-06 (×3): qty 2

## 2019-06-06 MED ORDER — OXYCODONE-ACETAMINOPHEN 5-325 MG PO TABS
2.0000 | ORAL_TABLET | ORAL | Status: DC | PRN
  Administered 2019-06-06: 10:00:00 2 via ORAL
  Filled 2019-06-06: qty 2

## 2019-06-06 MED ORDER — AMLODIPINE BESYLATE 5 MG PO TABS
5.0000 mg | ORAL_TABLET | Freq: Every day | ORAL | Status: DC
  Administered 2019-06-06 – 2019-06-07 (×2): 5 mg via ORAL
  Filled 2019-06-06 (×2): qty 1

## 2019-06-06 MED ORDER — OXYCODONE-ACETAMINOPHEN 5-325 MG PO TABS
2.0000 | ORAL_TABLET | ORAL | Status: DC
  Administered 2019-06-06 (×3): 2 via ORAL
  Filled 2019-06-06 (×3): qty 2

## 2019-06-06 NOTE — Progress Notes (Signed)
PROGRESS NOTE    Jonathan Mercer.  YQM:[id number] DOB: June 23, 1980 DOA: 06/04/2019  PCP: Patient, No Pcp Per    LOS - 1   Brief Narrative:  Jonathan Merceris a 38 y.o.malewithno pastmedical history other than substance abuse, whopresented to the ED on 2/23 complaining of acute onset of low back pain that woke him up this morning. Pain radiating down to the mid left lower extremity, and so severe he cannot tolerate standing or walking. Patient also reported dark brown-colored urine which he first noticed earlier that morning. States heavy alcohol use the night before, but no trauma or excessive physical exertion.  In the ED, hypertensive, labs showed AKI and rhabdomyolysis with CK over 15k.  Xray lumbar spine showed minimal lumbar spondylosis without acute findings. Patient admitted for observation to hospitalist service for management of nontraumatic rhabdomyolysis.    Subjective 2/25: Patient seen this morning at bedside.  No acute events reported overnight.  He reports poor sleep due to inability to get comfortable with back pain.  He noted slightly better pain control with Percocet 7.5 yesterday, but still requiring IV morphine.  He reports more pain helps best and allows him to sleep some.  He denies any other acute complaints including fevers or chills, chest pain or shortness of breath, nausea vomiting or diarrhea.  Does endorse constipation, took MiraLAX earlier.  Encouraged patient to drink plenty of fluids today for stool softeners to work best.  Assessment & Plan:   Principal Problem:   Rhabdomyolysis Active Problems:   AKI (acute kidney injury) (HCC)   Rhabdomyolysis Acute Kidney Injury, secondary to rhabdo most likely Total CK >15000 on admission. Got 1 L NS bolus in the ED.  No recent trauma, excessive exertion, or new medications.  Suspect this is due to alcohol/drug abuse. CK this AM up over 33k and renal function essentially unchanged.   --continue NS @ 150  cc/hr --recheck BMP and CK in the AM --avoid nephrotoxins and renally dose meds as indicated  Acute Low Back Pain with Radiculopathy - without trauma or known injury, patient woke up in severe pain morning of 2/23 after heavy drinking night before.  Unable to ambulate.  Differential includes disc herniation, foraminal stenosis less likely with acute onset, infection unlikely as patient afebrile and only minimal leukocytosis that resolved.  MRI of lumbar spine on 2/24 showed extensive edema-like intramuscular signal within the posterior paraspinal musculature of the mid to lower lumbar spine, ...can be seen in the setting of muscle strain/overuse, rhabdomyolysis, and small central disc protrusion at L5-S1 contacts and slightly displaces the descending S1 nerve roots, right slightly greater than left.   Adequate pain control continues to be a challenge. --Prednisone 60 mg daily, plan to d/c on Medrol dose pack if helpful --PT evaluation, home health PT recommended on discharge --ortho consulted, appreciate recs --MRI lumbar spine - pending, to evaluate for disc herniation --Titrate oral pain medications for adequate control without need for IV:    Increase Percocet to 10 mg every 4 hours scheduled    Add Mobic 50 mg daily, now that renal function recovered    Robaxin 1000 mg 4 times daily --bowel regimen while on pain meds  Transaminitis - worsening.  Mostly likely due to EtOH abuse and rhabdomyolysis.  On admission, AST 407, ALT 127, other LFT's within normal limits.  AST up to 549, ALT up to 248. Expect improvement as rhabdo resolves.   --RUQ ultrasound showed cholelithiasis without acute cholecystitis and no intrahepatic ductal  dilation. --hepatitis panel negative  Hypertension - no prior diagnosis, but hypertensive in the ED at 165/98. --started amlodipine 5 mg daily --PRN oral hydralazine --continue antihypertensive on discharge, close PCP follow up   DVT prophylaxis: Heparin   Code  Status: Full Code  Family Communication: Jonathan Mercer updated by speaker phone during encounter with patient this morning.  All questions answered.  Disposition Plan: Anticipate discharge home with home health PT in 24 to 48 hours pending adequate pain control with oral medication only and further improvement with rhabdo.  Patient continues to require hospital level care at this time for treatment of rhabdomyolysis and titrating of oral pain meds. Coming From home, independent Exp DC Date 2/26-27 Barriers pain control, treating rhabdo Medically Stable for Discharge?  No  Consultants:   Orthopedics  Procedures:   None  Antimicrobials:   None   Objective: Vitals:   06/05/19 2032 06/05/19 2153 06/06/19 0638 06/06/19 1220  BP: (!) 160/105 (!) 142/86 (!) 146/88 (!) 144/86  Pulse: 95 95 81 81  Resp: 16  16 20   Temp: 98.4 F (36.9 C)  (!) 97.4 F (36.3 C) 97.6 F (36.4 C)  TempSrc: Oral  Oral Oral  SpO2: 99%  98% 95%  Weight:      Height:        Intake/Output Summary (Last 24 hours) at 06/06/2019 1344 Last data filed at 06/06/2019 1113 Gross per 24 hour  Intake 2739.11 ml  Output 1400 ml  Net 1339.11 ml   Filed Weights   06/04/19 1141 06/04/19 1749  Weight: 95.3 kg 94.3 kg    Examination:  General exam: awake, alert, no acute distress HEENT: moist mucus membranes, hearing grossly normal  Respiratory system: CTAB, no wheezes, rales or rhonchi, normal respiratory effort. Cardiovascular system: normal S1/S2, RRR, no JVD, murmurs, rubs, gallops, no pedal edema.   Gastrointestinal system: soft, mildly distended, mildly tender diffusely without guarding or rebound, +bowel sounds. Central nervous system: A&O x4. no gross focal neurologic deficits, normal speech Extremities: moves all, no edema, normal tone Psychiatry: normal mood, congruent affect, judgement and insight appear normal    Data Reviewed: I have personally reviewed following labs and imaging studies  CBC: Recent  Labs  Lab 06/04/19 1152 06/04/19 1742 06/05/19 0529  WBC 12.0* 11.4* 7.5  NEUTROABS 9.6*  --   --   HGB 14.6 14.3 13.2  HCT 43.7 42.2 39.9  MCV 86.4 86.3 87.3  PLT 303 313 355   Basic Metabolic Panel: Recent Labs  Lab 06/04/19 1152 06/04/19 1444 06/04/19 1742 06/05/19 0529 06/06/19 0621  NA 138 138  --  140 140  K 4.4 4.1  --  3.6 3.7  CL 102 105  --  107 105  CO2 20* 20*  --  26 26  GLUCOSE 101* 93  --  123* 104*  BUN 13 12  --  14 8  CREATININE 1.26* 1.27* 1.33* 1.25* 1.10  CALCIUM 9.4 8.7*  --  8.6* 8.7*   GFR: Estimated Creatinine Clearance: 101.1 mL/min (by C-G formula based on SCr of 1.1 mg/dL). Liver Function Tests: Recent Labs  Lab 06/04/19 1444 06/06/19 0621  AST 407* 549*  ALT 127* 248*  ALKPHOS 55 49  BILITOT 1.1 0.9  PROT 7.9 7.4  ALBUMIN 4.2 3.5   No results for input(s): LIPASE, AMYLASE in the last 168 hours. No results for input(s): AMMONIA in the last 168 hours. Coagulation Profile: No results for input(s): INR, PROTIME in the last 168 hours. Cardiac Enzymes:  Recent Labs  Lab 06/04/19 1152 06/05/19 0529 06/06/19 0621  CKTOTAL 15,227* 33,582* 30,362*   BNP (last 3 results) No results for input(s): PROBNP in the last 8760 hours. HbA1C: No results for input(s): HGBA1C in the last 72 hours. CBG: No results for input(s): GLUCAP in the last 168 hours. Lipid Profile: No results for input(s): CHOL, HDL, LDLCALC, TRIG, CHOLHDL, LDLDIRECT in the last 72 hours. Thyroid Function Tests: No results for input(s): TSH, T4TOTAL, FREET4, T3FREE, THYROIDAB in the last 72 hours. Anemia Panel: No results for input(s): VITAMINB12, FOLATE, FERRITIN, TIBC, IRON, RETICCTPCT in the last 72 hours. Sepsis Labs: No results for input(s): PROCALCITON, LATICACIDVEN in the last 168 hours.  Recent Results (from the past 240 hour(s))  SARS CORONAVIRUS 2 (TAT 6-24 HRS) Nasopharyngeal Nasopharyngeal Swab     Status: None   Collection Time: 06/04/19  4:08 PM    Specimen: Nasopharyngeal Swab  Result Value Ref Range Status   SARS Coronavirus 2 NEGATIVE NEGATIVE Final    Comment: (NOTE) SARS-CoV-2 target nucleic acids are NOT DETECTED. The SARS-CoV-2 RNA is generally detectable in upper and lower respiratory specimens during the acute phase of infection. Negative results do not preclude SARS-CoV-2 infection, do not rule out co-infections with other pathogens, and should not be used as the sole basis for treatment or other patient management decisions. Negative results must be combined with clinical observations, patient history, and epidemiological information. The expected result is Negative. Fact Sheet for Patients: HairSlick.no Fact Sheet for Healthcare Providers: quierodirigir.com This test is not yet approved or cleared by the [place] FDA and  has been authorized for detection and/or diagnosis of SARS-CoV-2 by FDA under an Emergency Use Authorization (EUA). This EUA will remain  in effect (meaning this test can be used) for the duration of the COVID-19 declaration under Section 56 4(b)(1) of the Act, 21 U.S.C. section 360bbb-3(b)(1), unless the authorization is terminated or revoked sooner. Performed at [facility], [address], [place], [place] [address]          Radiology Studies: MR LUMBAR SPINE WO CONTRAST  Result Date: 06/05/2019 CLINICAL DATA:  Acute onset low back pain with left-sided radiculopathy EXAM: MRI LUMBAR SPINE WITHOUT CONTRAST TECHNIQUE: Multiplanar, multisequence MR imaging of the lumbar spine was performed. No intravenous contrast was administered. COMPARISON:  X-ray 06/04/2019 FINDINGS: Segmentation:  Standard. Alignment:  Physiologic. Vertebrae:  No fracture, evidence of discitis, or bone lesion. Conus medullaris and cauda equina: Conus extends to the L1 level. Conus and cauda equina appear normal. Paraspinal and other soft tissues: Extensive  edema-like intramuscular signal within the posterior paraspinal musculature of the mid to lower lumbar spine, left slightly worse than right. There is also T2 signal changes within the visualized portion of the left gluteus medius muscle belly. No intramuscular fluid collection. Disc levels: T12-L1: Unremarkable. L1-L2: Minimal disc height loss and diffuse disc bulge without foraminal or canal stenosis. L2-L3: Unremarkable. L3-L4: Unremarkable. L4-L5: Unremarkable. L5-S1: Disc desiccation with small central disc protrusion contacts and slightly displaces the descending S1 nerve roots, right slightly greater than left. No canal stenosis. No foraminal stenosis. IMPRESSION: 1. Extensive edema-like intramuscular signal within the posterior paraspinal musculature of the mid to lower lumbar spine, left slightly worse than right. Findings are nonspecific but can be seen in the setting of muscle strain/overuse, rhabdomyolysis, versus myositis. No intramuscular fluid collection to suggest myonecrosis. Similar findings are also present within the visualized left gluteus medius muscle. 2. No significant canal stenosis or evidence of  neural impingement of the lumbar spine. 3. Small central disc protrusion at L5-S1 contacts and slightly displaces the descending S1 nerve roots, right slightly greater than left. Electronically Signed   By: Duanne Guess D.O.   On: 06/05/2019 13:44   US Abdomen Limited RUQ  Result Date: 06/06/2019 CLINICAL DATA:  Transaminitis EXAM: ULTRASOUND ABDOMEN LIMITED RIGHT UPPER QUADRANT COMPARISON:  No comparison studies available. FINDINGS: Gallbladder: 13 mm gallstone. No gallbladder wall thickening or pericholecystic fluid. Star for reports no sonographic Murphy sign. Common bile duct: Diameter: 2 mm Liver: No focal lesion identified. Within normal limits in parenchymal echogenicity. Portal vein is patent on color Doppler imaging with normal direction of blood flow towards the liver. Other:  None. IMPRESSION: 1. Cholelithiasis. 2. No intra or extrahepatic biliary duct dilatation. Electronically Signed   By: Kennith Center M.D.   On: 06/06/2019 10:34        Scheduled Meds: . amLODipine  5 mg Oral Daily  . heparin  5,000 Units Subcutaneous Q8H  . meloxicam  15 mg Oral Daily  . oxyCODONE-acetaminophen  2 tablet Oral Q4H  . polyethylene glycol  17 g Oral Daily  . predniSONE  60 mg Oral Q breakfast   Continuous Infusions: . sodium chloride 100 mL/hr at 06/06/19 1059     LOS: 1 day    Time spent: 40 minutes    Pennie Banter, DO [facility]   If 7PM-7AM, please contact night-coverage [email or web address] 06/06/2019, 1:44 PM

## 2019-06-06 NOTE — Progress Notes (Addendum)
Physical Therapy Treatment Patient Details Name: Jonathan Mercer. MRN: [id number] DOB: 1980/04/28 Today's Date: 06/06/2019    History of Present Illness Jonathan Mercer is a 38yoM who comes to [facility] on 2/23 after awakeneing with severe low back and Left hip pain. Pt has been unable to tolerate standing/AMB. Labs reveal AKI, rhabdomyolysis c CK >15k. MRI lumbar spine showing "Extensive edema-like intramuscular signal within the posterior paraspinal musculature of the mid to lower lumbar spine, left slightly worse than right. There is also T2 signal changes within the visualized portion of the left gluteus medius muscle belly,...no central canal stenois,.Marland KitchenMarland KitchenSmall central disc protrusion at L5-S1 contacts and slightly displaces the descending S1 nerve roots, right slightly greater than left." PTA pt reports no difficulty with mobility.    PT Comments    Pt in bed upon entry, RN at bedside. Pt agreeable to session. Pain meds coordinated with RN prior to session. Pt able to AMB a bit faster with improved pain meds timing. Commenced strairs training, able to perform 10x at supervision level, likely could have done more, but exercising prudence as not to provoke pain exacerbation. Pt progressing well thus far, but a plan for pain management will be a must for mobility p DC.     Follow Up Recommendations  Outpatient PT;Follow surgeon's recommendation for DC plan and follow-up therapies;Supervision - Intermittent     Equipment Recommendations  Rolling walker with 5" wheels;3in1 (PT);Other (comment)    Recommendations for Other Services       Precautions / Restrictions Precautions Precautions: None Restrictions Weight Bearing Restrictions: No    Mobility  Bed Mobility Overal bed mobility: Needs Assistance Bed Mobility: Supine to Sit     Supine to sit: Min assist     General bed mobility comments: performs log roll with RN to EOB  Transfers Overall transfer level: Needs  assistance Equipment used: None Transfers: Sit to/from Stand Sit to Stand: Supervision         General transfer comment: slow and painfull like a competition 1RM squat, does not wait for RW.  Ambulation/Gait Ambulation/Gait assistance: Supervision Gait Distance (Feet): 150 Feet Assistive device: Rolling walker (2 wheeled) Gait Pattern/deviations: Step-to pattern     General Gait Details: more upright this date, a bit faster, pt attributes to antalgic meds   Stairs Stairs: Yes Stairs assistance: Min guard Stair Management: One rail Left;Step to pattern;Sideways Number of Stairs: 10 General stair comments: Up with Right, down with Left, going up Right side first.   Wheelchair Mobility    Modified Rankin (Stroke Patients Only)       Balance Overall balance assessment: Modified Independent                                          Cognition Arousal/Alertness: Awake/alert Behavior During Therapy: WFL for tasks assessed/performed Overall Cognitive Status: Within Functional Limits for tasks assessed                                        Exercises      General Comments        Pertinent Vitals/Pain Pain Assessment: 0-10 Pain Score: 6  Pain Location: Left paraspinal area ilium to T9 level; Right focal low back pain; Left groin (crease area) and trochanteric area. Pain Descriptors / Indicators:  Stabbing;Other (Comment) Pain Intervention(s): Limited activity within patient's tolerance;Monitored during session;Premedicated before session;Repositioned    Home Living                      Prior Function            PT Goals (current goals can now be found in the care plan section) Acute Rehab PT Goals Patient Stated Goal: decrease pain, be able to access home. PT Goal Formulation: With patient Time For Goal Achievement: 06/19/19 Potential to Achieve Goals: Good Progress towards PT goals: Progressing toward goals     Frequency    7X/week      PT Plan Current plan remains appropriate    Co-evaluation              AM-PAC PT "6 Clicks" Mobility   Outcome Measure  Help needed turning from your back to your side while in a flat bed without using bedrails?: A Little Help needed moving from lying on your back to sitting on the side of a flat bed without using bedrails?: A Little Help needed moving to and from a bed to a chair (including a wheelchair)?: A Little Help needed standing up from a chair using your arms (e.g., wheelchair or bedside chair)?: A Little Help needed to walk in hospital room?: A Little Help needed climbing 3-5 steps with a railing? : A Little 6 Click Score: 18    End of Session   Activity Tolerance: Patient limited by pain;Patient limited by fatigue Patient left: in chair;with call bell/phone within reach Nurse Communication: Mobility status;Precautions PT Visit Diagnosis: Difficulty in walking, not elsewhere classified (R26.2);Pain;Other abnormalities of gait and mobility (R26.89) Pain - Right/Left: Left Pain - part of body: Hip(back)     Time: 1610-9604 PT Time Calculation (min) (ACUTE ONLY): 17 min  Charges:  $Gait Training: 8-22 mins                     12:27 PM, 06/06/19 Etta Grandchild, PT, DPT Physical Therapist - [facility]  [phone] ([place])    [place] 06/06/2019, 12:20 PM

## 2019-06-06 NOTE — TOC Initial Note (Signed)
Transition of Care (TOC) - Initial/Assessment Note    Patient Details  Name: Jonathan Mercer. MRN: [id number] Date of Birth: 1980/05/14  Transition of [facility]) CM/SW Contact:    Chapman Fitch, RN Phone Number: 06/06/2019, 2:21 PM  Clinical Narrative:                 Patient admitted from home with Rhabdo Patient lives at home with wife.   Wife at bedside.   Patient is currently unemployed, and does not have insurance.  Wife states "I am working on getting [place] insurance"  PCP [place]. Updated in system  Pharmacy of choice [facility].  Patient not on any home medications   States he has reliable transportation   PT has assessed patient and current recommendations Outpatient PT.  Made referral Brad with [employer] for RW and BSC.  TO be delivered to room prior to discharge       Expected Discharge Plan: Home/Self Care Barriers to Discharge: Continued Medical Work up   Patient Goals and CMS Choice     Choice offered to / list presented to : Patient  Expected Discharge Plan and Services Expected Discharge Plan: Home/Self Care   Discharge Planning Services: CM Consult Post Acute Care Choice: Durable Medical Equipment Living arrangements for the past 2 months: Single Family Home                 DME Arranged: Walker rolling, 3-N-1 DME Agency: [facility] Date DME Agency Contacted: 06/06/19   Representative spoke with at DME Agency: Nida Boatman            Prior Living Arrangements/Services Living arrangements for the past 2 months: Single Family Home Lives with:: Minor Children, Spouse Patient language and need for interpreter reviewed:: Yes Do you feel safe going back to the place where you live?: Yes      Need for Family Participation in Patient Care: Yes (Comment) Care giver support system in place?: Yes (comment)   Criminal Activity/Legal Involvement Pertinent to Current Situation/Hospitalization: No - Comment as needed  Activities of Daily Living Home  Assistive Devices/Equipment: None ADL Screening (condition at time of admission) Patient's cognitive ability adequate to safely complete daily activities?: Yes Is the patient deaf or have difficulty hearing?: No Does the patient have difficulty seeing, even when wearing glasses/contacts?: No Does the patient have difficulty concentrating, remembering, or making decisions?: No Patient able to express need for assistance with ADLs?: Yes Does the patient have difficulty dressing or bathing?: No Independently performs ADLs?: Yes (appropriate for developmental age) Does the patient have difficulty walking or climbing stairs?: No Weakness of Legs: None Weakness of Arms/Hands: None  Permission Sought/Granted                  Emotional Assessment Appearance:: Appears stated age Attitude/Demeanor/Rapport: Gracious Affect (typically observed): Accepting Orientation: : Oriented to Self, Oriented to Place, Oriented to  Time, Oriented to Situation Alcohol / Substance Use: Alcohol Use    Admission diagnosis:  Rhabdomyolysis [M62.82] Non-traumatic rhabdomyolysis [M62.82] Patient Active Problem List   Diagnosis Date Noted  . Rhabdomyolysis 06/04/2019  . AKI (acute kidney injury) (HCC) 06/04/2019   PCP:  Abram Sander, MD Pharmacy:   [facility] [address] [place] - [address] [place] [address] Phone: [phone] Fax: [phone]  [facility] [address] [place] - [address] [place] [address] Phone: [phone] Fax: [phone]     Social  Determinants of Health (SDOH) Interventions    Readmission Risk Interventions No flowsheet data found.

## 2019-06-07 LAB — COMPREHENSIVE METABOLIC PANEL
ALT: 249 U/L — ABNORMAL HIGH (ref 0–44)
AST: 437 U/L — ABNORMAL HIGH (ref 15–41)
Albumin: 3.5 g/dL (ref 3.5–5.0)
Alkaline Phosphatase: 46 U/L (ref 38–126)
Anion gap: 7 (ref 5–15)
BUN: 9 mg/dL (ref 6–20)
CO2: 26 mmol/L (ref 22–32)
Calcium: 8.8 mg/dL — ABNORMAL LOW (ref 8.9–10.3)
Chloride: 106 mmol/L (ref 98–111)
Creatinine, Ser: 1.07 mg/dL (ref 0.61–1.24)
GFR calc Af Amer: >60 mL/min (ref >60)
GFR calc non Af Amer: >60 mL/min (ref >60)
Glucose, Bld: 151 mg/dL — ABNORMAL HIGH (ref 70–99)
Potassium: 3.5 mmol/L (ref 3.5–5.1)
Sodium: 139 mmol/L (ref 135–145)
Total Bilirubin: 0.8 mg/dL (ref 0.3–1.2)
Total Protein: 7.2 g/dL (ref 6.5–8.1)

## 2019-06-07 LAB — CK: Total CK: 24583 U/L — ABNORMAL HIGH (ref 49–397)

## 2019-06-07 MED ORDER — AMLODIPINE BESYLATE 5 MG PO TABS: 5.0000 mg | ORAL_TABLET | Freq: Every day | ORAL | 1 refills | Status: AC

## 2019-06-07 MED ORDER — OXYCODONE-ACETAMINOPHEN 5-325 MG PO TABS: 2.0000 | ORAL_TABLET | ORAL | 0 refills | Status: AC | PRN

## 2019-06-07 MED ORDER — MAGNESIUM CITRATE PO SOLN
1.0000 | Freq: Once | ORAL | Status: AC
  Administered 2019-06-07: 13:00:00 1 via ORAL
  Filled 2019-06-07: qty 296

## 2019-06-07 MED ORDER — BISACODYL 5 MG PO TBEC: 5.0000 mg | DELAYED_RELEASE_TABLET | Freq: Every day | ORAL | 0 refills | Status: AC | PRN

## 2019-06-07 MED ORDER — METHOCARBAMOL 500 MG PO TABS: 1000.0000 mg | ORAL_TABLET | Freq: Four times a day (QID) | ORAL | 0 refills | Status: AC

## 2019-06-07 MED ORDER — OXYCODONE-ACETAMINOPHEN 5-325 MG PO TABS
2.0000 | ORAL_TABLET | ORAL | Status: DC
  Administered 2019-06-07 (×3): 2 via ORAL
  Filled 2019-06-07 (×3): qty 2

## 2019-06-07 MED ORDER — MELOXICAM 15 MG PO TABS: 15.0000 mg | ORAL_TABLET | Freq: Every day | ORAL | 1 refills | Status: AC

## 2019-06-07 NOTE — Discharge Summary (Signed)
Physician Discharge Summary  Jonathan Mercer. ELF:[id number] DOB: [birth year] DOA: 06/04/2019  PCP: Abram Sander, MD  Admit date: 06/04/2019 Discharge date: 06/07/2019  Admitted From: Home Disposition:  Home  Recommendations for Outpatient Follow-up:  1. Follow up with PCP as soon as possible, within 1 week 2. Please obtain BMP/CBC in one week 3. Please see PCP if you continue to require pain medications for your back 4. Please follow up on patient's blood pressure.  He was started on amlodipine for uncontrolled BP during admission. 5. Please check renal and liver function, as patient was admitted with rhabdomyolysis.  Home Health: no, outpatient PT Equipment/Devices: rolling walker, bedside commode   Discharge Condition: Stable  CODE STATUS: Full  Diet recommendation: Heart healthy (low sodium)  Brief/Interim Summary:  Jonathan Merceris a 38 y.o.malewithno pastmedical historyother than substance abuse, wh[facility]ed [facility]complaining of acute onset of low back pain that woke him up this morning. Pain radiating down to the mid left lower extremity, and so severe he cannot tolerate standing or walking.Patient also reported darkbrown-coloredurinewhich he first noticedearlier that morning. States heavy alcohol usethenightbefore, but no trauma or excessive physical exertion. In the ED, hypertensive, labs showed AKI and rhabdomyolysis with CK over 15k. Xray lumbar spine showed minimal lumbar spondylosis without acute findings. Patient admitted for observation to hospitalist service for management of nontraumatic rhabdomyolysis.    Rhabdomyolysis Acute Kidney Injury, secondary to rhabdo most likely Total CK >15000 on admission. Got 1 L NS bolus in the ED.No recent trauma, excessive exertion, or new medications. Suspect this is due to alcohol/drug abuse. CK trend decreasing.  AKI improved with IV hydration. --recheck BMP and CK in follow up  Acute  Low Back Pain with Radiculopathy - without trauma or known injury, patient woke up in severe pain morning of 2/23 after heavy drinking night before. Unable to ambulate. Differential includes disc herniation, foraminal stenosis less likely with acute onset, infection unlikely as patient afebrile and only minimal leukocytosis that resolved.  MRI of lumbar spine on 2/24 showed extensive edema-like intramuscular signal within the posterior paraspinal musculature of the mid to lower lumbar spine, ...can be seen in the setting of muscle strain/overuse, rhabdomyolysis, and small central disc protrusion at L5-S1 contacts and slightly displaces the descending S1 nerve roots, right slightly greater than left.   --PT evaluation, home health PT recommended on discharge --ortho consulted, appreciate recs --Oral pain regimen for adequate pain control:    Increase Percocet to 10 mg every 4 hours scheduled    Add Mobic 50 mg daily, now that renal function recovered    Robaxin 1000 mg 4 times daily --bowel regimen while on pain meds, needed mag citrate before discharge  Transaminitis - improving.  Mostly likely due to EtOH abuseand rhabdomyolysis. On admission, AST 407, ALT 127, other LFT's within normal limits.  AST up to 549, ALT up to 248. Expect improvement as rhabdo resolves.   --RUQ ultrasound showed cholelithiasis without acute cholecystitis and no intrahepatic ductal dilation. --hepatitis panel negative  Hypertension - no prior diagnosis, but hypertensive in the EDat 165/98 and during admission. --started amlodipine  --PRN oral hydralazine --continue antihypertensive on discharge, close PCP follow up   Discharge Diagnoses: Principal Problem:   Rhabdomyolysis Active Problems:   AKI (acute kidney injury) [facility])    Discharge Instructions   Discharge Instructions    Call MD for:   Complete by: As directed    If urine turns dark color again   Call MD for:  severe uncontrolled pain   Complete  by: As directed    Call MD for:  temperature >100.4   Complete by: As directed    Diet - low sodium heart healthy   Complete by: As directed    Discharge instructions   Complete by: As directed    For your back pain: --continue taking medications as prescribed.  As pain improves, use the Percocet less often and see how you feel.  Please use it as sparingly as possible.  Take Mobic (aka meloxicam) and Robaxin (aka methocarbamol) regularly to help with pain control.   --see PCP as soon as possible.  They will refer you to physical therapy.  Please attend physical therapy, it is proven the best treatment for your type of back pain.  For your "Rhabdo" (muscle injury):   Please continue drinking PLENTY of fluids for the next week or so.  If your urine color changes dark again, call your doctor.  For your Blood pressure: Started on Norvasc (aka amlodipine) due to high blood pressures.  Please see your PCP to follow up and check BP on medocation.   Increase activity slowly   Complete by: As directed      Allergies as of 06/07/2019   No Known Allergies     Medication List    TAKE these medications   amLODipine 5 MG tablet Commonly known as: NORVASC Take 1 tablet (5 mg total) by mouth daily. Start taking on: June 08, 2019   bisacodyl 5 MG EC tablet Commonly known as: DULCOLAX Take 1 tablet (5 mg total) by mouth daily as needed for moderate constipation.   meloxicam 15 MG tablet Commonly known as: MOBIC Take 1 tablet (15 mg total) by mouth daily. Start taking on: June 08, 2019   methocarbamol 500 MG tablet Commonly known as: ROBAXIN Take 2 tablets (1,000 mg total) by mouth 4 (four) times daily for 14 days.   oxyCODONE-acetaminophen 5-325 MG tablet Commonly known as: PERCOCET/ROXICET Take 2 tablets by mouth every 4 (four) hours as needed for up to 5 days for severe pain.            Durable Medical Equipment  (From admission, onward)         Start     Ordered    06/07/19 1254  For home use only DME 3 n 1  Once     06/07/19 1253   06/06/19 1358  For home use only DME Walker rolling  Once    Question Answer Comment  Walker: With 5 Inch Wheels   Patient needs a walker to treat with the following condition Lumbar radiculopathy, acute      06/06/19 1357         Follow-up Information    Adamo, Jeris Penta, MD. Schedule an appointment as soon as possible for a visit in 4 day(s).   Specialty: Family Medicine Why: within 1 week Contact information: [address] [place] [address] [phone]          No Known Allergies  Consultations:  orthopedics   Procedures/Studies: DG Lumbar Spine 2-3 Views  Result Date: 06/04/2019 CLINICAL DATA:  Left low back pain radiating to the left leg. EXAM: LUMBAR SPINE - 2-3 VIEW COMPARISON:  None. FINDINGS: There are 5 non rib-bearing lumbar type vertebrae. Lumbar vertebral alignment is normal. Partially visualized mild left convex curvature of the lower thoracic spine may be positional or reflect mild scoliosis. No fracture is identified. There is minimal endplate spurring in  the lumbar and lower thoracic spine. Disc space heights are preserved. IMPRESSION: Minimal lumbar spondylosis without acute osseous abnormality. Electronically Signed   By: Logan Bores M.D.   On: 06/04/2019 13:43   MR LUMBAR SPINE WO CONTRAST  Result Date: 06/05/2019 CLINICAL DATA:  Acute onset low back pain with left-sided radiculopathy EXAM: MRI LUMBAR SPINE WITHOUT CONTRAST TECHNIQUE: Multiplanar, multisequence MR imaging of the lumbar spine was performed. No intravenous contrast was administered. COMPARISON:  X-ray 06/04/2019 FINDINGS: Segmentation:  Standard. Alignment:  Physiologic. Vertebrae:  No fracture, evidence of discitis, or bone lesion. Conus medullaris and cauda equina: Conus extends to the L1 level. Conus and cauda equina appear normal. Paraspinal and other soft tissues: Extensive edema-like intramuscular signal within  the posterior paraspinal musculature of the mid to lower lumbar spine, left slightly worse than right. There is also T2 signal changes within the visualized portion of the left gluteus medius muscle belly. No intramuscular fluid collection. Disc levels: T12-L1: Unremarkable. L1-L2: Minimal disc height loss and diffuse disc bulge without foraminal or canal stenosis. L2-L3: Unremarkable. L3-L4: Unremarkable. L4-L5: Unremarkable. L5-S1: Disc desiccation with small central disc protrusion contacts and slightly displaces the descending S1 nerve roots, right slightly greater than left. No canal stenosis. No foraminal stenosis. IMPRESSION: 1. Extensive edema-like intramuscular signal within the posterior paraspinal musculature of the mid to lower lumbar spine, left slightly worse than right. Findings are nonspecific but can be seen in the setting of muscle strain/overuse, rhabdomyolysis, versus myositis. No intramuscular fluid collection to suggest myonecrosis. Similar findings are also present within the visualized left gluteus medius muscle. 2. No significant canal stenosis or evidence of neural impingement of the lumbar spine. 3. Small central disc protrusion at L5-S1 contacts and slightly displaces the descending S1 nerve roots, right slightly greater than left. Electronically Signed   By: Davina Poke D.O.   On: 06/05/2019 13:44   US Abdomen Limited RUQ  Result Date: 06/06/2019 CLINICAL DATA:  Transaminitis EXAM: ULTRASOUND ABDOMEN LIMITED RIGHT UPPER QUADRANT COMPARISON:  No comparison studies available. FINDINGS: Gallbladder: 13 mm gallstone. No gallbladder wall thickening or pericholecystic fluid. Star for reports no sonographic Murphy sign. Common bile duct: Diameter: 2 mm Liver: No focal lesion identified. Within normal limits in parenchymal echogenicity. Portal vein is patent on color Doppler imaging with normal direction of blood flow towards the liver. Other: None. IMPRESSION: 1. Cholelithiasis. 2. No  intra or extrahepatic biliary duct dilatation. Electronically Signed   By: Misty Stanley M.D.   On: 06/06/2019 10:34       Subjective: Patient seen this AM.  Complains of persistent constipation.  Pain control better, at least can tolerate ambulating better today.  Agrees with discharge home and [facility] PT.  Advised of need to closely follow up with PCP.   Discharge Exam: Vitals:   06/07/19 0641 06/07/19 1215  BP: 132/77 128/77  Pulse: 79 86  Resp: 18 18  Temp: 97.8 F (36.6 C) 97.8 F (36.6 C)  SpO2: 97% 96%   Vitals:   06/06/19 1220 06/06/19 2012 06/07/19 0641 06/07/19 1215  BP: (!) 144/86 (!) 149/91 132/77 128/77  Pulse: 81 97 79 86  Resp: 20 18 18 18   Temp: 97.6 F (36.4 C) 98.4 F (36.9 C) 97.8 F (36.6 C) 97.8 F (36.6 C)  TempSrc: Oral Oral Oral Oral  SpO2: 95% 97% 97% 96%  Weight:      Height:        General: Pt is alert, awake, not in acute distress Cardiovascular:  RRR, S1/S2 +, no rubs, no gallops Respiratory: CTA bilaterally, no wheezing, no rhonchi Abdominal: Soft, NT, mild distention, bowel sounds + Extremities: no edema, no cyanosis    The results of significant diagnostics from this hospitalization (including imaging, microbiology, ancillary and laboratory) are listed below for reference.     Microbiology: Recent Results (from the past 240 hour(s))  SARS CORONAVIRUS 2 (TAT 6-24 HRS) Nasopharyngeal Nasopharyngeal Swab     Status: None   Collection Time: 06/04/19  4:08 PM   Specimen: Nasopharyngeal Swab  Result Value Ref Range Status   SARS Coronavirus 2 NEGATIVE NEGATIVE Final    Comment: (NOTE) SARS-CoV-2 target nucleic acids are NOT DETECTED. The SARS-CoV-2 RNA is generally detectable in upper and lower respiratory specimens during the acute phase of infection. Negative results do not preclude SARS-CoV-2 infection, do not rule out co-infections with other pathogens, and should not be used as the sole basis for treatment or other patient  management decisions. Negative results must be combined with clinical observations, patient history, and epidemiological information. The expected result is Negative. Fact Sheet for Patients: HairSlick.no Fact Sheet for Healthcare Providers: quierodirigir.com This test is not yet approved or cleared by the [place] FDA and  has been authorized for detection and/or diagnosis of SARS-CoV-2 by FDA under an Emergency Use Authorization (EUA). This EUA will remain  in effect (meaning this test can be used) for the duration of the COVID-19 declaration under Section 56 4(b)(1) of the Act, 21 U.S.C. section 360bbb-3(b)(1), unless the authorization is terminated or revoked sooner. Performed at [facility], [address], [place], [place] [address]      Labs: BNP (last 3 results) No results for input(s): BNP in the last 8760 hours. Basic Metabolic Panel: Recent Labs  Lab 06/04/19 1152 06/04/19 1152 06/04/19 1444 06/04/19 1742 06/05/19 0529 06/06/19 0621 06/07/19 0417  NA 138  --  138  --  140 140 139  K 4.4  --  4.1  --  3.6 3.7 3.5  CL 102  --  105  --  107 105 106  CO2 20*  --  20*  --  26 26 26   GLUCOSE 101*  --  93  --  123* 104* 151*  BUN 13  --  12  --  14 8 9   CREATININE 1.26*   < > 1.27* 1.33* 1.25* 1.10 1.07  CALCIUM 9.4  --  8.7*  --  8.6* 8.7* 8.8*   < > = values in this interval not displayed.   Liver Function Tests: Recent Labs  Lab 06/04/19 1444 06/06/19 0621 06/07/19 0417  AST 407* 549* 437*  ALT 127* 248* 249*  ALKPHOS 55 49 46  BILITOT 1.1 0.9 0.8  PROT 7.9 7.4 7.2  ALBUMIN 4.2 3.5 3.5   No results for input(s): LIPASE, AMYLASE in the last 168 hours. No results for input(s): AMMONIA in the last 168 hours. CBC: Recent Labs  Lab 06/04/19 1152 06/04/19 1742 06/05/19 0529  WBC 12.0* 11.4* 7.5  NEUTROABS 9.6*  --   --   HGB 14.6 14.3 13.2  HCT 43.7 42.2 39.9  MCV 86.4 86.3 87.3  PLT  303 313 263   Cardiac Enzymes: Recent Labs  Lab 06/04/19 1152 06/05/19 0529 06/06/19 0621  CKTOTAL 15,227* 33,582* 30,362*   BNP: Invalid input(s): POCBNP CBG: No results for input(s): GLUCAP in the last 168 hours. D-Dimer No results for input(s): DDIMER in the last 72 hours. Hgb A1c No results for  input(s): HGBA1C in the last 72 hours. Lipid Profile No results for input(s): CHOL, HDL, LDLCALC, TRIG, CHOLHDL, LDLDIRECT in the last 72 hours. Thyroid function studies No results for input(s): TSH, T4TOTAL, T3FREE, THYROIDAB in the last 72 hours.  Invalid input(s): FREET3 Anemia work up No results for input(s): VITAMINB12, FOLATE, FERRITIN, TIBC, IRON, RETICCTPCT in the last 72 hours. Urinalysis    Component Value Date/Time   COLORURINE AMBER (A) 06/04/2019 1400   APPEARANCEUR CLOUDY (A) 06/04/2019 1400   LABSPEC 1.013 06/04/2019 1400   PHURINE 5.0 06/04/2019 1400   GLUCOSEU NEGATIVE 06/04/2019 1400   HGBUR LARGE (A) 06/04/2019 1400   BILIRUBINUR NEGATIVE 06/04/2019 1400   KETONESUR 20 (A) 06/04/2019 1400   PROTEINUR 100 (A) 06/04/2019 1400   NITRITE NEGATIVE 06/04/2019 1400   LEUKOCYTESUR NEGATIVE 06/04/2019 1400   Sepsis Labs Invalid input(s): PROCALCITONIN,  WBC,  LACTICIDVEN Microbiology Recent Results (from the past 240 hour(s))  SARS CORONAVIRUS 2 (TAT 6-24 HRS) Nasopharyngeal Nasopharyngeal Swab     Status: None   Collection Time: 06/04/19  4:08 PM   Specimen: Nasopharyngeal Swab  Result Value Ref Range Status   SARS Coronavirus 2 NEGATIVE NEGATIVE Final    Comment: (NOTE) SARS-CoV-2 target nucleic acids are NOT DETECTED. The SARS-CoV-2 RNA is generally detectable in upper and lower respiratory specimens during the acute phase of infection. Negative results do not preclude SARS-CoV-2 infection, do not rule out co-infections with other pathogens, and should not be used as the sole basis for treatment or other patient management decisions. Negative results  must be combined with clinical observations, patient history, and epidemiological information. The expected result is Negative. Fact Sheet for Patients: HairSlick.no Fact Sheet for Healthcare Providers: quierodirigir.com This test is not yet approved or cleared by the [place] FDA and  has been authorized for detection and/or diagnosis of SARS-CoV-2 by FDA under an Emergency Use Authorization (EUA). This EUA will remain  in effect (meaning this test can be used) for the duration of the COVID-19 declaration under Section 56 4(b)(1) of the Act, 21 U.S.C. section 360bbb-3(b)(1), unless the authorization is terminated or revoked sooner. Performed at [facility], [address], [place], [place] [address]      Time coordinating discharge: Over 30 minutes  SIGNED:   Pennie Banter, DO [facility] 06/07/2019, 1:04 PM   If 7PM-7AM, please contact night-coverage [email or web address]

## 2019-06-07 NOTE — Progress Notes (Addendum)
Physical Therapy Treatment Patient Details Name: Jonathan Mercer. MRN: [id number] DOB: 05/22/1980 Today's Date: 06/07/2019    History of Present Illness Jonathan Mercer is a 38yoM who comes to [facility] on 2/23 after awakeneing with severe low back and Left hip pain. Pt has been unable to tolerate standing/AMB. Labs reveal AKI, rhabdomyolysis c CK >15k. MRI lumbar spine showing "Extensive edema-like intramuscular signal within the posterior paraspinal musculature of the mid to lower lumbar spine, left slightly worse than right. There is also T2 signal changes within the visualized portion of the left gluteus medius muscle belly,...no central canal stenois,.Marland KitchenMarland KitchenSmall central disc protrusion at L5-S1 contacts and slightly displaces the descending S1 nerve roots, right slightly greater than left." PTA pt reports no difficulty with mobility.    PT Comments    Pt was long sitting in bed upon arriving. MD entered room and discussed care plans going forward. He reports 6/10 pain but was cooperative and motivated throughout. Pt is A and O and states, " I want to go home." Pt was able to perform all mobility, transfers, and gait without physical assist however requires increased time to perform. He tolerated ambulation ~ 200 ft with RW without LOB however very slow cadence and uses step to pattern. Unwilling to trial stairs this date but does correctly state proper way to safely perform. Overall tolerated session well. PT recommends D/C to home with outpatient PT to follow.     Follow Up Recommendations  Outpatient PT;Follow surgeon's recommendation for DC plan and follow-up therapies     Equipment Recommendations  Rolling walker with 5" wheels;3in1 (PT);Other (comment)    Recommendations for Other Services       Precautions / Restrictions Precautions Precautions: None Restrictions Weight Bearing Restrictions: No    Mobility  Bed Mobility Overal bed mobility: Modified Independent Bed Mobility:  Supine to Sit     Supine to sit: Modified independent (Device/Increase time) Sit to supine: Supervision   General bed mobility comments: Increased time to perform with vcs throughout for technique, sequencing, and safety. No physical assist required  Transfers Overall transfer level: Needs assistance Equipment used: Rolling walker (2 wheeled) Transfers: Sit to/from Stand Sit to Stand: Supervision         General transfer comment: Increased time to perform with no listing assist required. pt growns during transfer but once standing pain resolves.  Ambulation/Gait Ambulation/Gait assistance: Supervision Gait Distance (Feet): 200 Feet Assistive device: Rolling walker (2 wheeled) Gait Pattern/deviations: Step-to pattern     General Gait Details: Pt was able to ambulate increased distance but continues to have slow step to pattern. No LOB noted    Stairs         General stair comments: Pt was unwilling to trial stairs this date even with max encouragement to do so. He was able to correctly states how to perform when d/c to home.   Wheelchair Mobility    Modified Rankin (Stroke Patients Only)       Balance                                            Cognition Arousal/Alertness: Awake/alert Behavior During Therapy: WFL for tasks assessed/performed Overall Cognitive Status: Within Functional Limits for tasks assessed  General Comments: Pt is A and O x 4 and cooperative and pleasant throughout      Exercises      General Comments        Pertinent Vitals/Pain Pain Assessment: 0-10 Pain Score: 6  Pain Location: Left paraspinal area ilium to T9 level; Right focal low back pain; Left groin (crease area) and trochanteric area. Pain Descriptors / Indicators: Stabbing;Other (Comment) Pain Intervention(s): Limited activity within patient's tolerance;Monitored during session;Premedicated before session     Home Living                      Prior Function            PT Goals (current goals can now be found in the care plan section) Acute Rehab PT Goals Patient Stated Goal: To go home Progress towards PT goals: Progressing toward goals    Frequency    7X/week      PT Plan Current plan remains appropriate    Co-evaluation              AM-PAC PT "6 Clicks" Mobility   Outcome Measure  Help needed turning from your back to your side while in a flat bed without using bedrails?: A Little Help needed moving from lying on your back to sitting on the side of a flat bed without using bedrails?: A Little Help needed moving to and from a bed to a chair (including a wheelchair)?: A Little Help needed standing up from a chair using your arms (e.g., wheelchair or bedside chair)?: A Little Help needed to walk in hospital room?: A Little Help needed climbing 3-5 steps with a railing? : A Little 6 Click Score: 18    End of Session Equipment Utilized During Treatment: Gait belt Activity Tolerance: Patient tolerated treatment well Patient left: in bed;with call bell/phone within reach;with bed alarm set Nurse Communication: Mobility status;Precautions PT Visit Diagnosis: Difficulty in walking, not elsewhere classified (R26.2);Pain;Other abnormalities of gait and mobility (R26.89) Pain - Right/Left: Left Pain - part of body: Hip     Time: 3893-7342 PT Time Calculation (min) (ACUTE ONLY): 17 min  Charges:  $Gait Training: 8-22 mins                     Julaine Fusi PTA 06/07/19, 12:37 PM

## 2019-06-07 NOTE — Plan of Care (Signed)
Discharge order received. Patient mental status is at baseline. Vital signs stable . No signs of acute distress. Discharge instructions given. Patient verbalized understanding. No other issues noted at this time.   

## 2019-06-07 NOTE — TOC Transition Note (Signed)
Transition of Care [facility]) - CM/SW Discharge Note   Patient Details  Name: Jonathan Mercer. MRN: [id number] Date of Birth: 06/20/1980  [facility] CM/SW Contact:  Chapman Fitch, RN Phone Number: 06/07/2019, 2:26 PM   Clinical Narrative:     Patient to discharge today RW and BSC delivered to room by Nida Boatman with [employer]  Provided information on [facility] for outpatient PT  Final next level of care: Home/Self Care Barriers to Discharge: No Barriers Identified   Patient Goals and CMS Choice     Choice offered to / list presented to : Patient  Discharge Placement                       Discharge Plan and Services   Discharge Planning Services: CM Consult Post Acute Care Choice: Durable Medical Equipment          DME Arranged: Dan Humphreys rolling, 3-N-1 DME Agency: [employer] Date DME Agency Contacted: 06/06/19   Representative spoke with at DME Agency: Nida Boatman            Social Determinants of Health (SDOH) Interventions     Readmission Risk Interventions No flowsheet data found.

## 2019-06-25 ENCOUNTER — Ambulatory Visit: Payer: Self-pay | Attending: Family Medicine | Admitting: Physical Therapy

## 2019-06-25 ENCOUNTER — Other Ambulatory Visit: Payer: Self-pay

## 2019-06-25 DIAGNOSIS — M6281 Muscle weakness (generalized): Secondary | ICD-10-CM | POA: Insufficient documentation

## 2019-06-25 DIAGNOSIS — M6282 Rhabdomyolysis: Secondary | ICD-10-CM | POA: Insufficient documentation

## 2019-06-25 DIAGNOSIS — M5442 Lumbago with sciatica, left side: Secondary | ICD-10-CM | POA: Insufficient documentation

## 2019-06-25 NOTE — Patient Instructions (Signed)
Access Code: [email or web address]: [email or web address]: 03/16/2021Prepared by: Casimiro Needle SherkExercises  Supine Single Knee to Chest Stretch - 2 x daily - 7 x weekly - 1 sets - 3 reps  Supine Double Knee to Chest - 2 x daily - 7 x weekly - 1 sets - 3 reps  Supine Lower Trunk Rotation - 2 x daily - 7 x weekly - 1 sets - 3 reps  Seated Trunk Rotation - Arms Crossed - 2 x daily - 7 x weekly - 1 sets - 3 reps

## 2019-06-29 ENCOUNTER — Encounter: Payer: Self-pay | Admitting: Physical Therapy

## 2019-06-29 NOTE — Therapy (Signed)
[facility] [address] [place], [place], [address] Phone: [phone]   Fax:  [phone]  Physical Therapy Evaluation  Patient Details  Name: Jonathan Mercer. MRN: [id number] Date of Birth: December 23, 1980 Referring Provider (PT): Hendricks Milo, FNP   Encounter Date: 06/25/2019  PT End of Session - 06/29/19 1750    Visit Number  1    Number of Visits  9    Date for PT Re-Evaluation  07/23/19    Authorization - Visit Number  1    Authorization - Number of Visits  10    PT Start Time  0907    PT Stop Time  1016    PT Time Calculation (min)  69 min    Activity Tolerance  Patient limited by pain;Patient tolerated treatment well    Behavior During Therapy  [facility] for tasks assessed/performed       History reviewed. No pertinent past medical history.  History reviewed. No pertinent surgical history.  There were no vitals filed for this visit.   Subjective Assessment - 06/29/19 1728    Subjective  Pt. states he was recently hospitalized for rhabdomyolysis due to excessive alcohol consumption on 06/04/2019.  Pt. reports significant L lumbar/hip muscle tenderness and pain.    Pertinent History  see medical chart    Limitations  Standing;Walking    Diagnostic tests  see MRI (scanned in EPIC).    Patient Stated Goals  Decrease LBP/ improve pain-free mobility.    Currently in Pain?  Yes    Pain Score  8     Pain Location  Back    Pain Orientation  Left    Pain Descriptors / Indicators  Tender    Pain Type  Acute pain    Pain Radiating Towards  L glut./ thigh         [facility] PT Assessment - 06/29/19 0001      Assessment   Medical Diagnosis  Low back pain, acute    Referring Provider (PT)  Hendricks Milo, FNP    Onset Date/Surgical Date  06/04/19    Prior Therapy  No      Precautions   Precautions  None      Balance Screen   Has the patient fallen in the past 6 months  No      Prior Function   Level of Independence   Independent    Vocation  Unemployed    Vocation Requirements  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       Cognition   Overall Cognitive Status  Within Functional Limits for tasks assessed        B LE muscle strength grossly 5/5 MMT (increase L low back pain with resisted hip flexion)- excellent hip/LE strength    Objective measurements completed on examination: See above findings.        PT Education - 06/29/19 1749    Education Details  See lumbar stretches/ discuss massage    Person(s) Educated  Patient;Spouse    Methods  Explanation;Demonstration;Handout    Comprehension  Verbalized understanding;Returned demonstration          PT Long Term Goals - 06/29/19 1806      PT LONG TERM GOAL #1   Title  Pt. will increase FOTO to 74 to improve pain-free mobility.    Baseline  Initial FOTO: 51    Time  4    Period  Weeks    Status  New  Target Date  07/23/19      PT LONG TERM GOAL #2   Title  Pt. will demonstrate full lumbar flexion/ lateral flexion with no c/o L sided low back pain to improve pain-free mobility.    Baseline  Standing lumbar flexion (limited 25%), extension (limited 50%)- "pinching pain", lumbar rotn. WFL, L lateral flexion (increase pain). Pt. reports numbness in L lumbar musculature as compared to R.    Time  4    Period  Weeks    Status  New    Target Date  07/23/19      PT LONG TERM GOAL #3   Title  Pt. will report no L low back pain with lifting/ carrying tasks to improve return to prior level of function.    Baseline  8/10 L lumbar/ hip pain with lifting/ carrying grocery bags.    Time  4    Period  Weeks    Status  New    Target Date  07/23/19        Plan - 06/29/19 1751    Clinical Impression Statement  Pt. is a 39 y/o male with recent hospitalization due to rhabdomyolysis and referred to PT with significant c/o L lumbar pain.  Pt. states he has 8/10 L low back/ hip pain with lifting/ carrying tasks.  Pt. states he takes a muscle relaxer at  night to sleep (prone position).  Standing lumbar flexion (limited 25%), extension (limited 50%)- "pinching pain", lumbar rotn. WFL, L lateral flexion (increase pain).  Pt. reports numbness in L lumbar musculature as compared to R.  Pt. presents with significant tenderness with palpation (T12-L5) into L hip/ glut. musculature.  Pt. uanble to tolerate STM/ L unilateral PA mobs. to lumbar spine.  MRI reveals edema-like intramuscular signal within the posterior paraspinal musculature on the mid to lower spine.  Pt. ambulates with slight L antalgic gait, esp. with initial steps after standing.  PT unable to elicit a muscle contraction on L lumbar paraspinals with use of e-stim.  No sensation on L with e-stim as comapred to R.  FOTO: initial 51/ goal 74.  Pt. will benefit from skilled PT services to decrease L lumbar pain/ improve functional mobility.    Examination-Activity Limitations  Carry;Lift    Stability/Clinical Decision Making  Evolving/Moderate complexity    Clinical Decision Making  Moderate    Rehab Potential  Fair    PT Frequency  2x / week    PT Duration  4 weeks    PT Treatment/Interventions  ADLs/Self Care Home Management;Cryotherapy;Electrical Stimulation;Moist Heat;Gait training;Stair training;Functional mobility training;Therapeutic activities;Neuromuscular re-education;Therapeutic exercise;Patient/family education;Balance training;Manual techniques;Passive range of motion;Dry needling    PT Next Visit Plan  Reassess L lumbar sensation/ mobility next tx.    PT Home Exercise Plan  [id number]       Patient will benefit from skilled therapeutic intervention in order to improve the following deficits and impairments:  Abnormal gait, Decreased mobility, Hypomobility, Improper body mechanics, Increased edema, Decreased range of motion, Decreased activity tolerance, Decreased strength, Impaired flexibility, Postural dysfunction, Pain  Visit Diagnosis: Acute left-sided low back pain with  left-sided sciatica  Muscle weakness (generalized)  Non-traumatic rhabdomyolysis     Problem List Patient Active Problem List   Diagnosis Date Noted  . Rhabdomyolysis 06/04/2019  . AKI (acute kidney injury) (HCC) 06/04/2019   Cammie Mcgee, PT, DPT # [id number] 06/29/2019, 6:17 PM  [facility] [address] [place], [place], [address] Phone: [phone]  Fax:  [phone]  Name: Jonathan Mercer. MRN: [id number] Date of Birth: [birth year]

## 2019-07-02 ENCOUNTER — Ambulatory Visit: Payer: Self-pay | Admitting: Physical Therapy

## 2019-07-02 ENCOUNTER — Other Ambulatory Visit: Payer: Self-pay

## 2019-07-02 DIAGNOSIS — M6282 Rhabdomyolysis: Secondary | ICD-10-CM

## 2019-07-02 DIAGNOSIS — M5442 Lumbago with sciatica, left side: Secondary | ICD-10-CM

## 2019-07-02 DIAGNOSIS — M6281 Muscle weakness (generalized): Secondary | ICD-10-CM

## 2019-07-03 ENCOUNTER — Encounter: Payer: Self-pay | Admitting: Physical Therapy

## 2019-07-03 NOTE — Therapy (Signed)
[facility] [address] [place], [place], [address] Phone: [phone]   Fax:  [phone]  Physical Therapy Treatment  Patient Details  Name: Jonathan Mercer. MRN: [id number] Date of Birth: May 12, 1980 Referring Provider (PT): Hendricks Milo, FNP   Encounter Date: 07/02/2019  PT End of Session - 07/03/19 1914    Visit Number  2    Number of Visits  9    Date for PT Re-Evaluation  07/23/19    Authorization - Visit Number  2    Authorization - Number of Visits  10    PT Start Time  2778    PT Stop Time  2423    PT Time Calculation (min)  31 min    Activity Tolerance  Patient limited by pain;Patient tolerated treatment well    Behavior During Therapy  [facility] for tasks assessed/performed       History reviewed. No pertinent past medical history.  History reviewed. No pertinent surgical history.  There were no vitals filed for this visit.  Subjective Assessment - 07/03/19 1908    Subjective  Pt. has not received CAID approval at this time.  Pt. states he has been using Biofreeze and completing HEP.  Pt. reports no L lumbar pain but reports he has "numbness".  Pt. reports he was sore for a day after initial evaluation but better overall.    Pertinent History  see medical chart    Limitations  Standing;Walking    Diagnostic tests  see MRI (scanned in EPIC).    Patient Stated Goals  Decrease LBP/ improve pain-free mobility.    Currently in Pain?  Yes    Pain Score  6     Pain Location  Back    Pain Orientation  Left    Pain Descriptors / Indicators  Numbness          No tx. Session.  PT discussed current HEP and reassessed L lumbar pain/ numbness.      PT Long Term Goals - 06/29/19 1806      PT LONG TERM GOAL #1   Title  Pt. will increase FOTO to 74 to improve pain-free mobility.    Baseline  Initial FOTO: 51    Time  4    Period  Weeks    Status  New    Target Date  07/23/19      PT LONG TERM GOAL #2   Title   Pt. will demonstrate full lumbar flexion/ lateral flexion with no c/o L sided low back pain to improve pain-free mobility.    Baseline  Standing lumbar flexion (limited 25%), extension (limited 50%)- "pinching pain", lumbar rotn. WFL, L lateral flexion (increase pain). Pt. reports numbness in L lumbar musculature as compared to R.    Time  4    Period  Weeks    Status  New    Target Date  07/23/19      PT LONG TERM GOAL #3   Title  Pt. will report no L low back pain with lifting/ carrying tasks to improve return to prior level of function.    Baseline  8/10 L lumbar/ hip pain with lifting/ carrying grocery bags.    Time  4    Period  Weeks    Status  New    Target Date  07/23/19            Plan - 07/03/19 1914    Clinical Impression Statement  PT reviewed  pts. HEP and reassessed L lumbar tenderness/ numbness/ pain.  Pt. reports minimal pain with palpation but significant c/o sharp pain with use of Hypervolt (low setting) to L lumbar/ superior glut. musculature.  No change to HEP at this time.    Examination-Activity Limitations  Carry;Lift    Stability/Clinical Decision Making  Evolving/Moderate complexity    Clinical Decision Making  Moderate    Rehab Potential  Fair    PT Frequency  2x / week    PT Duration  4 weeks    PT Treatment/Interventions  ADLs/Self Care Home Management;Cryotherapy;Electrical Stimulation;Moist Heat;Gait training;Stair training;Functional mobility training;Therapeutic activities;Neuromuscular re-education;Therapeutic exercise;Patient/family education;Balance training;Manual techniques;Passive range of motion;Dry needling    PT Next Visit Plan  Progress HEP/ reassess L lumbar sensitivity.    PT Home Exercise Plan  [id number]       Patient will benefit from skilled therapeutic intervention in order to improve the following deficits and impairments:  Abnormal gait, Decreased mobility, Hypomobility, Improper body mechanics, Increased edema, Decreased range of  motion, Decreased activity tolerance, Decreased strength, Impaired flexibility, Postural dysfunction, Pain  Visit Diagnosis: Acute left-sided low back pain with left-sided sciatica  Muscle weakness (generalized)  Non-traumatic rhabdomyolysis     Problem List Patient Active Problem List   Diagnosis Date Noted  . Rhabdomyolysis 06/04/2019  . AKI (acute kidney injury) (HCC) 06/04/2019   Cammie Mcgee, PT, DPT # [phone] 07/03/2019, 7:20 PM  [facility] [address] [place], [place], [address] Phone: [phone]   Fax:  [phone]  Name: Jonathan Mercer. MRN: [id number] Date of Birth: 06/06/1980

## 2019-07-09 ENCOUNTER — Ambulatory Visit: Payer: Self-pay | Admitting: Physical Therapy

## 2019-07-16 ENCOUNTER — Ambulatory Visit: Payer: Self-pay | Attending: Family Medicine | Admitting: Physical Therapy

## 2020-11-06 IMAGING — US US ABDOMEN LIMITED
1 series · 14 of 25 positions shown · non-contrast
Comparison: No comparison studies available.

CLINICAL DATA: Transaminitis

EXAM:
ULTRASOUND ABDOMEN LIMITED RIGHT UPPER QUADRANT

[Series 1: us abdomen limited · 0.25mm/px · 14 of 51 slices shown]
[im 1/51]
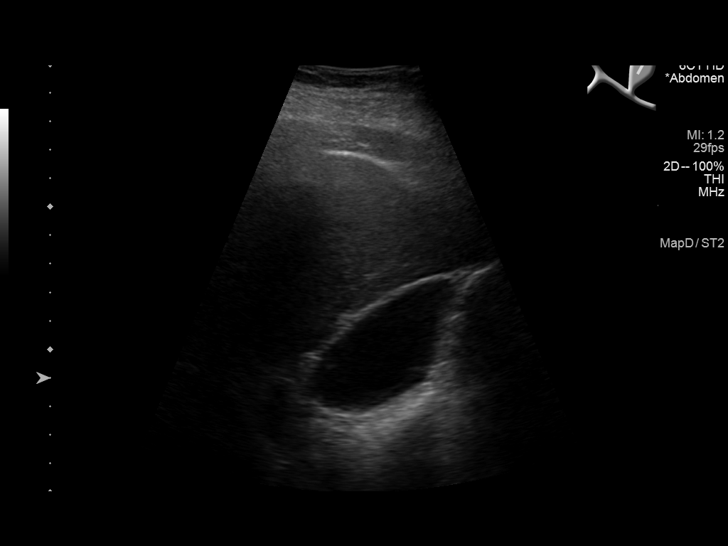
[im 5/51]
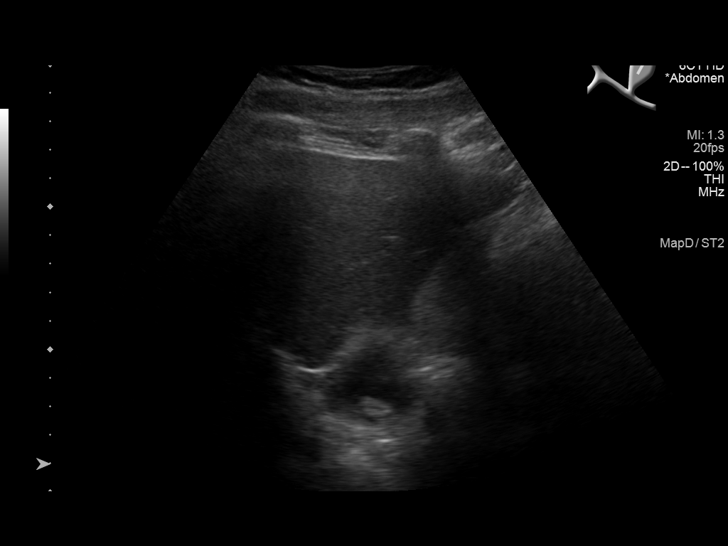
[im 9/51]
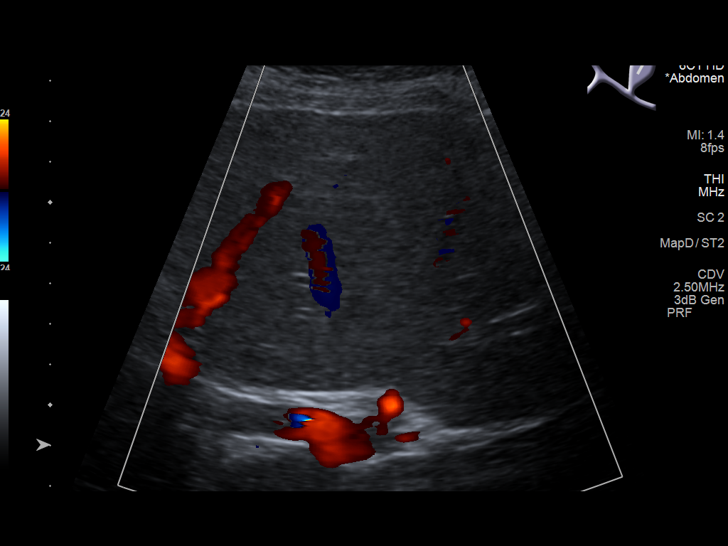
[im 13/51]
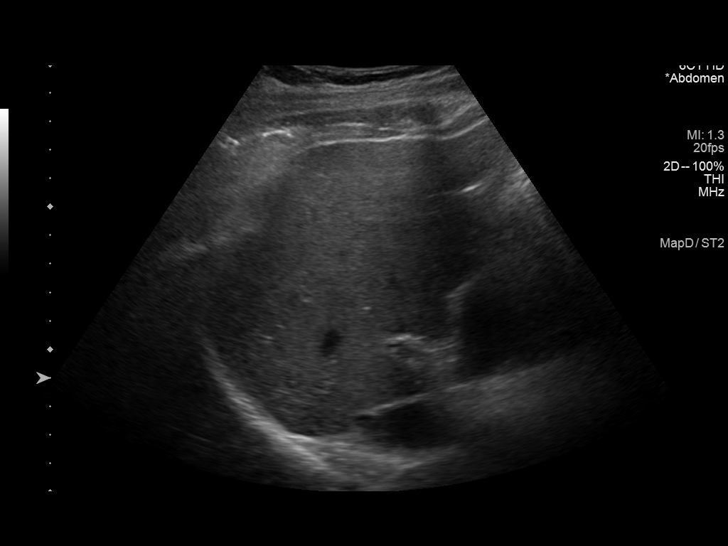
[im 17/51]
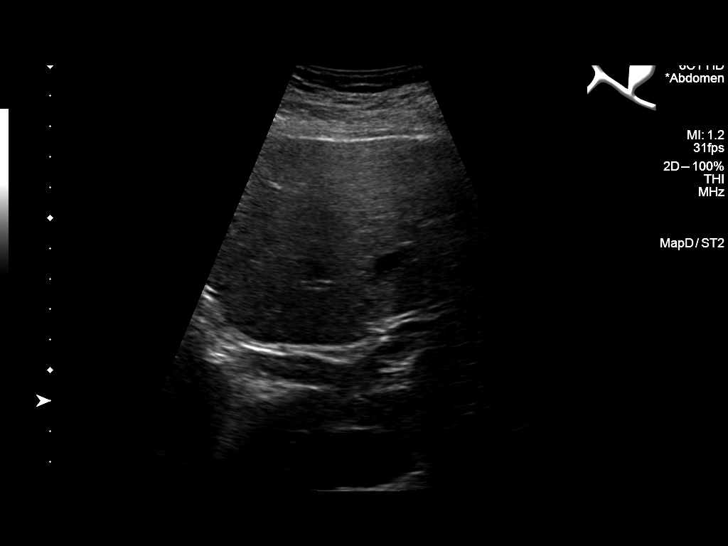
[im 19/51]
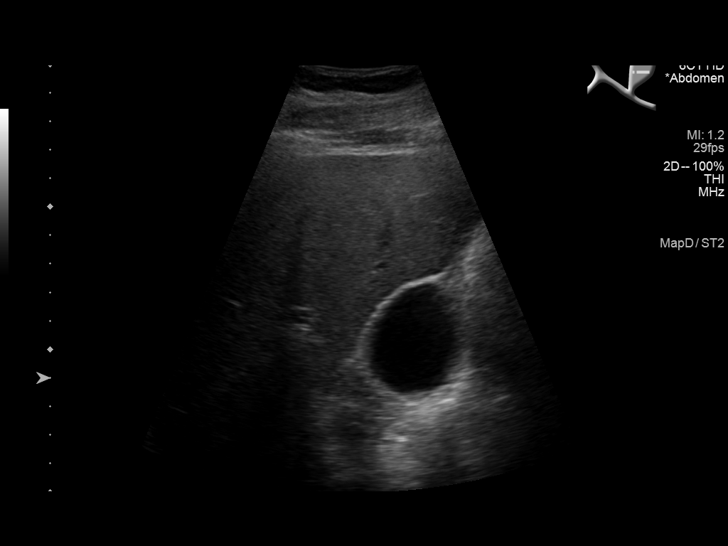
[im 23/51]
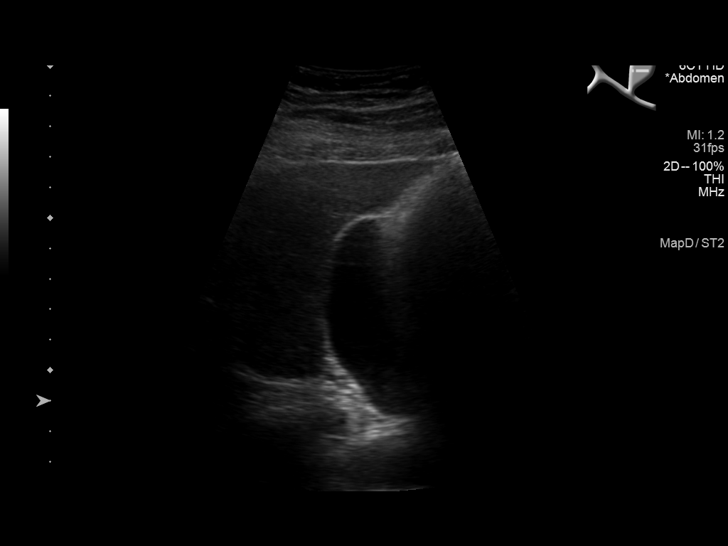
[im 28/51]
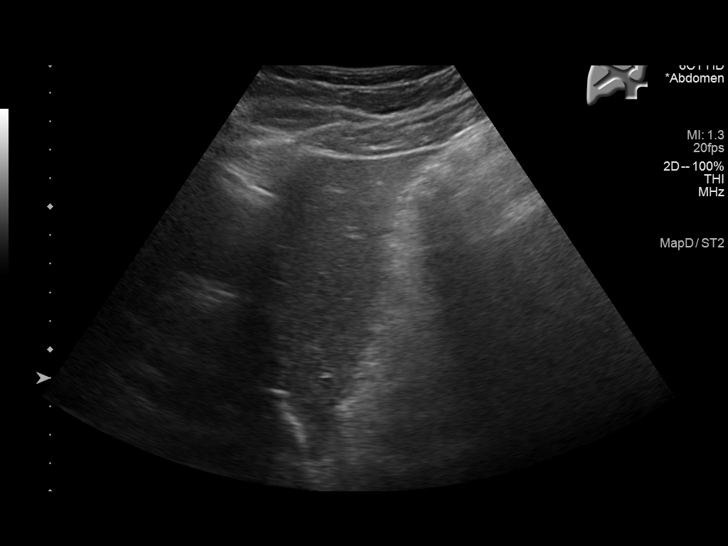
[im 32/51]
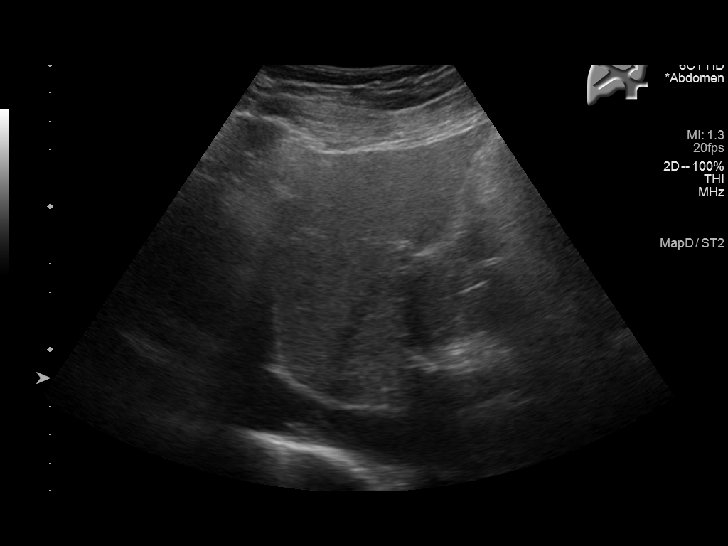
[im 34/51]
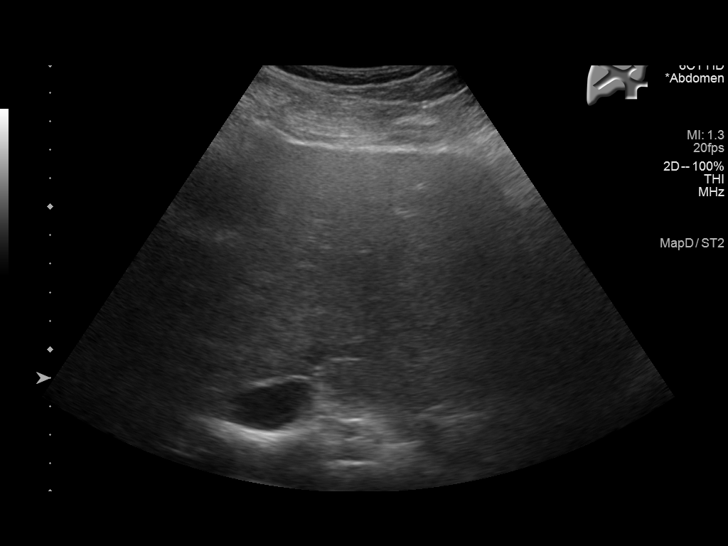
[im 38/51]
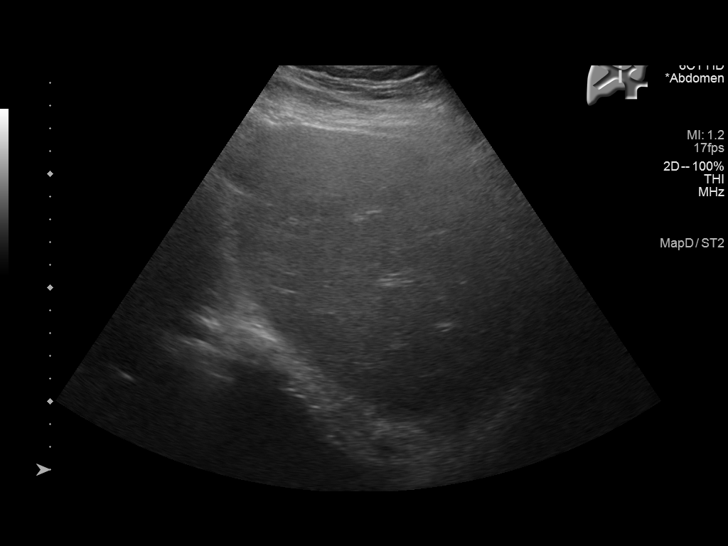
[im 42/51]
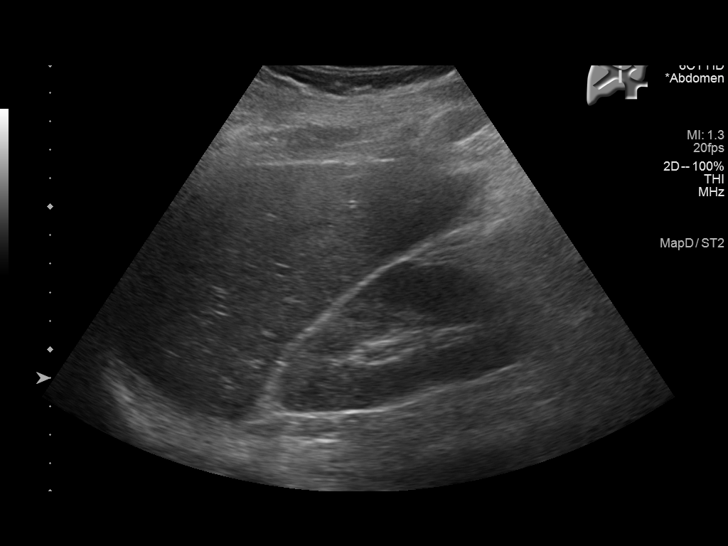
[im 46/51]
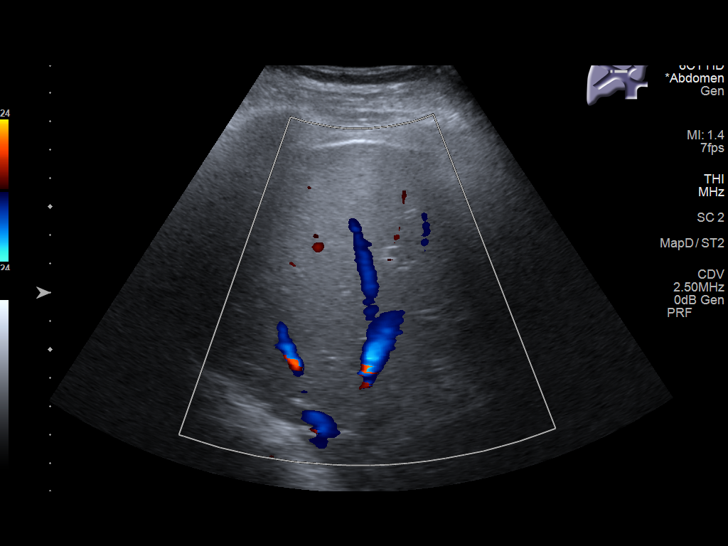
[im 51/51]
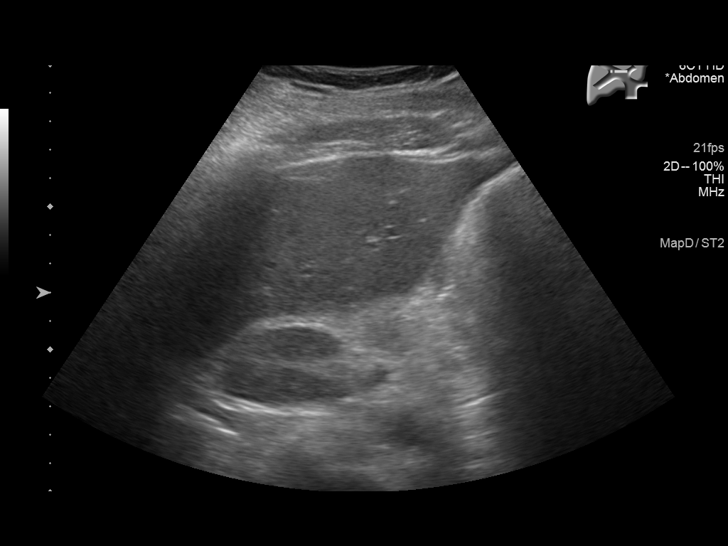

[14 of 25 positions shown; findings below may reference images not displayed]

FINDINGS: Gallbladder:

13 mm gallstone. No gallbladder wall thickening or pericholecystic
fluid. Star for reports no sonographic Murphy sign.

Common bile duct:

Diameter: 2 mm

Liver:

No focal lesion identified. Within normal limits in parenchymal
echogenicity. Portal vein is patent on color Doppler imaging with
normal direction of blood flow towards the liver.

Other: None.
IMPRESSION: 1. Cholelithiasis.
2. No intra or extrahepatic biliary duct dilatation.
